# Patient Record
Sex: Female | Born: 1990 | Race: Black or African American | Hispanic: No | Marital: Single | Smoking: Former smoker
Health system: Southern US, Community
[De-identification: ages and names within clinical notes are randomized; demographics above are authoritative.]

## PROBLEM LIST (undated history)

## (undated) DIAGNOSIS — C8190 Hodgkin lymphoma, unspecified, unspecified site: Secondary | ICD-10-CM

## (undated) DIAGNOSIS — C801 Malignant (primary) neoplasm, unspecified: Secondary | ICD-10-CM

## (undated) HISTORY — PX: LYMPH NODE BIOPSY: SHX201

## (undated) HISTORY — PX: PORT-A-CATH REMOVAL: SHX5289

---
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
## 2011-07-19 ENCOUNTER — Emergency Department: Payer: Self-pay | Admitting: *Deleted

## 2011-07-21 ENCOUNTER — Emergency Department: Payer: Self-pay | Admitting: *Deleted

## 2012-03-03 ENCOUNTER — Emergency Department: Payer: Self-pay | Admitting: Emergency Medicine

## 2012-03-03 LAB — URINALYSIS, COMPLETE
Bilirubin,UR: NEGATIVE
Blood: NEGATIVE
Glucose,UR: NEGATIVE mg/dL (ref 0–75)
Specific Gravity: 1.027 (ref 1.003–1.030)
Squamous Epithelial: 6
WBC UR: 4 /HPF (ref 0–5)

## 2012-03-03 LAB — COMPREHENSIVE METABOLIC PANEL
Alkaline Phosphatase: 123 U/L (ref 50–136)
Anion Gap: 10 (ref 7–16)
Bilirubin,Total: 0.4 mg/dL (ref 0.2–1.0)
Calcium, Total: 8.7 mg/dL (ref 8.5–10.1)
Chloride: 105 mmol/L (ref 98–107)
Co2: 25 mmol/L (ref 21–32)
SGOT(AST): 26 U/L (ref 15–37)
SGPT (ALT): 20 U/L

## 2012-03-03 LAB — CBC
HCT: 35.6 % (ref 35.0–47.0)
MCH: 28.7 pg (ref 26.0–34.0)
MCV: 91 fL (ref 80–100)
Platelet: 276 10*3/uL (ref 150–440)
RBC: 3.92 10*6/uL (ref 3.80–5.20)
RDW: 13.9 % (ref 11.5–14.5)

## 2012-03-03 LAB — PREGNANCY, URINE: Pregnancy Test, Urine: POSITIVE m[IU]/mL

## 2012-03-22 ENCOUNTER — Emergency Department: Payer: Self-pay | Admitting: Emergency Medicine

## 2012-03-26 ENCOUNTER — Emergency Department: Payer: Self-pay | Admitting: Emergency Medicine

## 2012-03-27 LAB — CBC
HGB: 11.4 g/dL — ABNORMAL LOW (ref 12.0–16.0)
MCH: 28.9 pg (ref 26.0–34.0)
MCV: 88 fL (ref 80–100)
Platelet: 297 10*3/uL (ref 150–440)
RBC: 3.96 10*6/uL (ref 3.80–5.20)
RDW: 13.5 % (ref 11.5–14.5)
WBC: 11.1 10*3/uL — ABNORMAL HIGH (ref 3.6–11.0)

## 2012-03-27 LAB — COMPREHENSIVE METABOLIC PANEL
Albumin: 3.5 g/dL (ref 3.4–5.0)
Anion Gap: 7 (ref 7–16)
Bilirubin,Total: 0.2 mg/dL (ref 0.2–1.0)
Calcium, Total: 8.7 mg/dL (ref 8.5–10.1)
Chloride: 105 mmol/L (ref 98–107)
Co2: 26 mmol/L (ref 21–32)
Creatinine: 0.76 mg/dL (ref 0.60–1.30)
EGFR (African American): >60
Osmolality: 274 (ref 275–301)
Potassium: 3.7 mmol/L (ref 3.5–5.1)
Sodium: 138 mmol/L (ref 136–145)

## 2012-03-27 LAB — URINALYSIS, COMPLETE
Bilirubin,UR: NEGATIVE
Leukocyte Esterase: NEGATIVE
Ph: 6 (ref 4.5–8.0)
Protein: NEGATIVE
Specific Gravity: 1.018 (ref 1.003–1.030)
Squamous Epithelial: <1

## 2012-03-27 LAB — HCG, QUANTITATIVE, PREGNANCY: Beta Hcg, Quant.: 48960 m[IU]/mL — ABNORMAL HIGH

## 2012-10-04 ENCOUNTER — Emergency Department: Payer: Self-pay | Admitting: Emergency Medicine

## 2012-10-04 LAB — URINALYSIS, COMPLETE
Bilirubin,UR: NEGATIVE
Blood: NEGATIVE
Glucose,UR: NEGATIVE mg/dL (ref 0–75)
Ketone: NEGATIVE
Protein: NEGATIVE
RBC,UR: 2 /HPF (ref 0–5)
Specific Gravity: 1.013 (ref 1.003–1.030)
WBC UR: 4 /HPF (ref 0–5)

## 2012-10-04 LAB — CBC
HCT: 33 % — ABNORMAL LOW (ref 35.0–47.0)
HGB: 11 g/dL — ABNORMAL LOW (ref 12.0–16.0)
MCHC: 33.3 g/dL (ref 32.0–36.0)
MCV: 87 fL (ref 80–100)
Platelet: 254 10*3/uL (ref 150–440)
RDW: 13.6 % (ref 11.5–14.5)
WBC: 13.4 10*3/uL — ABNORMAL HIGH (ref 3.6–11.0)

## 2012-10-04 LAB — COMPREHENSIVE METABOLIC PANEL
Albumin: 3.2 g/dL — ABNORMAL LOW (ref 3.4–5.0)
Anion Gap: 9 (ref 7–16)
BUN: 5 mg/dL — ABNORMAL LOW (ref 7–18)
Bilirubin,Total: 0.3 mg/dL (ref 0.2–1.0)
Calcium, Total: 8.9 mg/dL (ref 8.5–10.1)
Chloride: 103 mmol/L (ref 98–107)
Creatinine: 0.58 mg/dL — ABNORMAL LOW (ref 0.60–1.30)
Osmolality: 268 (ref 275–301)
Potassium: 3.4 mmol/L — ABNORMAL LOW (ref 3.5–5.1)
SGOT(AST): 17 U/L (ref 15–37)
Sodium: 136 mmol/L (ref 136–145)

## 2012-12-16 ENCOUNTER — Observation Stay: Payer: Self-pay | Admitting: Obstetrics and Gynecology

## 2012-12-16 LAB — URINALYSIS, COMPLETE
Bacteria: NONE SEEN
Bilirubin,UR: NEGATIVE
Ketone: NEGATIVE
Leukocyte Esterase: NEGATIVE
Protein: NEGATIVE
RBC,UR: 1 /HPF (ref 0–5)
Specific Gravity: 1.014 (ref 1.003–1.030)
Squamous Epithelial: 1

## 2012-12-16 LAB — FETAL FIBRONECTIN
Appearance: NORMAL
Fetal Fibronectin: NEGATIVE

## 2013-10-22 ENCOUNTER — Emergency Department: Payer: Self-pay | Admitting: Emergency Medicine

## 2014-12-03 ENCOUNTER — Emergency Department
Admit: 2014-12-03 | Disposition: A | Discharge status: Home / Self Care | Payer: Self-pay | Admitting: Emergency Medicine

## 2014-12-03 LAB — URINALYSIS, COMPLETE
BILIRUBIN, UR: NEGATIVE
Glucose,UR: NEGATIVE mg/dL (ref 0–75)
Leukocyte Esterase: NEGATIVE
Nitrite: NEGATIVE
Ph: 6 (ref 4.5–8.0)
Protein: 100
Specific Gravity: 1.025 (ref 1.003–1.030)

## 2014-12-03 LAB — COMPREHENSIVE METABOLIC PANEL
ALBUMIN: 4.6 g/dL
ANION GAP: 9 (ref 7–16)
Alkaline Phosphatase: 118 U/L
BUN: 22 mg/dL — ABNORMAL HIGH
Bilirubin,Total: 1 mg/dL
Calcium, Total: 8.9 mg/dL
Chloride: 105 mmol/L
Co2: 24 mmol/L
Creatinine: 1.28 mg/dL — ABNORMAL HIGH
EGFR (African American): >60
EGFR (Non-African Amer.): 59 — ABNORMAL LOW
GLUCOSE: 99 mg/dL
POTASSIUM: 3.4 mmol/L — AB
SGOT(AST): 47 U/L — ABNORMAL HIGH
SGPT (ALT): 20 U/L
Sodium: 138 mmol/L
TOTAL PROTEIN: 8.2 g/dL — AB

## 2014-12-03 LAB — DRUG SCREEN, URINE
AMPHETAMINES, UR SCREEN: NEGATIVE
BENZODIAZEPINE, UR SCRN: NEGATIVE
Barbiturates, Ur Screen: NEGATIVE
COCAINE METABOLITE, UR ~~LOC~~: NEGATIVE
Cannabinoid 50 Ng, Ur ~~LOC~~: NEGATIVE
MDMA (ECSTASY) UR SCREEN: NEGATIVE
Methadone, Ur Screen: NEGATIVE
Opiate, Ur Screen: NEGATIVE
Phencyclidine (PCP) Ur S: NEGATIVE
Tricyclic, Ur Screen: NEGATIVE

## 2014-12-03 LAB — ETHANOL

## 2014-12-03 LAB — CBC
HCT: 34.4 % — AB (ref 35.0–47.0)
HGB: 11.1 g/dL — ABNORMAL LOW (ref 12.0–16.0)
MCH: 27.9 pg (ref 26.0–34.0)
MCHC: 32.3 g/dL (ref 32.0–36.0)
MCV: 86 fL (ref 80–100)
Platelet: 355 10*3/uL (ref 150–440)
RBC: 3.99 10*6/uL (ref 3.80–5.20)
RDW: 14.3 % (ref 11.5–14.5)
WBC: 18.4 10*3/uL — AB (ref 3.6–11.0)

## 2014-12-03 LAB — ACETAMINOPHEN LEVEL: Acetaminophen: <10 ug/mL

## 2014-12-03 LAB — SALICYLATE LEVEL: Salicylates, Serum: <4 mg/dL

## 2016-02-23 ENCOUNTER — Emergency Department
Admission: EM | Admit: 2016-02-23 | Discharge: 2016-02-24 | Disposition: A | Discharge status: Home / Self Care | Payer: Medicaid Other | Attending: Student | Admitting: Student

## 2016-02-23 ENCOUNTER — Encounter: Payer: Self-pay | Admitting: Emergency Medicine

## 2016-02-23 ENCOUNTER — Emergency Department: Payer: Medicaid Other

## 2016-02-23 DIAGNOSIS — Z79899 Other long term (current) drug therapy: Secondary | ICD-10-CM | POA: Insufficient documentation

## 2016-02-23 DIAGNOSIS — R079 Chest pain, unspecified: Secondary | ICD-10-CM | POA: Insufficient documentation

## 2016-02-23 DIAGNOSIS — R002 Palpitations: Secondary | ICD-10-CM | POA: Diagnosis not present

## 2016-02-23 DIAGNOSIS — R42 Dizziness and giddiness: Secondary | ICD-10-CM | POA: Diagnosis present

## 2016-02-23 DIAGNOSIS — Z8571 Personal history of Hodgkin lymphoma: Secondary | ICD-10-CM | POA: Insufficient documentation

## 2016-02-23 DIAGNOSIS — C801 Malignant (primary) neoplasm, unspecified: Secondary | ICD-10-CM | POA: Insufficient documentation

## 2016-02-23 HISTORY — DX: Hodgkin lymphoma, unspecified, unspecified site: C81.90

## 2016-02-23 HISTORY — DX: Malignant (primary) neoplasm, unspecified: C80.1

## 2016-02-23 LAB — CBC WITH DIFFERENTIAL/PLATELET
BASOS ABS: 0.1 10*3/uL (ref 0–0.1)
BASOS PCT: 1 %
EOS ABS: 0.1 10*3/uL (ref 0–0.7)
EOS PCT: 0 %
HCT: 33.6 % — ABNORMAL LOW (ref 35.0–47.0)
Hemoglobin: 11.2 g/dL — ABNORMAL LOW (ref 12.0–16.0)
Lymphocytes Relative: 24 %
Lymphs Abs: 3.5 10*3/uL (ref 1.0–3.6)
MCH: 28.5 pg (ref 26.0–34.0)
MCHC: 33.4 g/dL (ref 32.0–36.0)
MCV: 85.3 fL (ref 80.0–100.0)
Monocytes Absolute: 0.7 10*3/uL (ref 0.2–0.9)
Monocytes Relative: 5 %
Neutro Abs: 10.3 10*3/uL — ABNORMAL HIGH (ref 1.4–6.5)
Neutrophils Relative %: 70 %
PLATELETS: 370 10*3/uL (ref 150–440)
RBC: 3.94 MIL/uL (ref 3.80–5.20)
RDW: 13.4 % (ref 11.5–14.5)
WBC: 14.7 10*3/uL — AB (ref 3.6–11.0)

## 2016-02-23 LAB — COMPREHENSIVE METABOLIC PANEL
ALT: 17 U/L (ref 14–54)
AST: 21 U/L (ref 15–41)
Albumin: 4.1 g/dL (ref 3.5–5.0)
Alkaline Phosphatase: 108 U/L (ref 38–126)
Anion gap: 9 (ref 5–15)
BUN: 17 mg/dL (ref 6–20)
CHLORIDE: 107 mmol/L (ref 101–111)
CO2: 22 mmol/L (ref 22–32)
Calcium: 8.5 mg/dL — ABNORMAL LOW (ref 8.9–10.3)
Creatinine, Ser: 0.88 mg/dL (ref 0.44–1.00)
GFR calc Af Amer: >60 mL/min (ref >60)
GFR calc non Af Amer: >60 mL/min (ref >60)
Glucose, Bld: 95 mg/dL (ref 65–99)
Potassium: 3.1 mmol/L — ABNORMAL LOW (ref 3.5–5.1)
SODIUM: 138 mmol/L (ref 135–145)
Total Bilirubin: 0.4 mg/dL (ref 0.3–1.2)
Total Protein: 7.5 g/dL (ref 6.5–8.1)

## 2016-02-23 LAB — FIBRIN DERIVATIVES D-DIMER (ARMC ONLY): FIBRIN DERIVATIVES D-DIMER (ARMC): 168 (ref 0–499)

## 2016-02-23 LAB — TROPONIN I: Troponin I: <0.03 ng/mL (ref <0.03)

## 2016-02-23 LAB — TSH: TSH: 0.367 u[IU]/mL (ref 0.350–4.500)

## 2016-02-23 LAB — GLUCOSE, CAPILLARY: Glucose-Capillary: 103 mg/dL — ABNORMAL HIGH (ref 65–99)

## 2016-02-23 LAB — T4, FREE: Free T4: 0.99 ng/dL (ref 0.61–1.12)

## 2016-02-23 MED ORDER — ONDANSETRON HCL 4 MG/2ML IJ SOLN
4.0000 mg | Freq: Once | INTRAMUSCULAR | Status: AC
Start: 2016-02-23 — End: 2016-02-23
  Administered 2016-02-23: 4 mg via INTRAVENOUS
  Filled 2016-02-23: qty 2

## 2016-02-23 MED ORDER — SODIUM CHLORIDE 0.9 % IV BOLUS (SEPSIS)
1000.0000 mL | Freq: Once | INTRAVENOUS | Status: AC
  Administered 2016-02-23: 1000 mL via INTRAVENOUS

## 2016-02-23 MED ORDER — OXYCODONE HCL 5 MG PO TABS
10.0000 mg | ORAL_TABLET | Freq: Once | ORAL | Status: AC
  Administered 2016-02-23: 10 mg via ORAL
  Filled 2016-02-23: qty 2

## 2016-02-23 MED ORDER — ACETAMINOPHEN 500 MG PO TABS
1000.0000 mg | ORAL_TABLET | Freq: Once | ORAL | Status: AC
  Administered 2016-02-23: 1000 mg via ORAL
  Filled 2016-02-23: qty 2

## 2016-02-23 NOTE — ED Notes (Addendum)
Pt arrives via ems from a parking lot after beginning to feel numb, tingly, and dizzy. Pt thought because she hadn't eaten she was feeling that way, no hx of diabetes, ems reports initial heart rate of 170, pt was given 12mg  adenosine and then 10mg  of cardiazem, pt then could vagal and bring her heart rate down to the 120's at arrival to Er pt's rate is 130-140. Pt c/o pain at her supraclavicular notch. Pt is postpartum 6 mos

## 2016-02-23 NOTE — ED Provider Notes (Signed)
[facility] Emergency Department Provider Note   ____________________________________________  Time seen: Approximately 9:34 PM  I have reviewed the triage vital signs and the nursing notes.   HISTORY  Chief Complaint Tachycardia    HPI AKELIA LAUGHTON is a 25 y.o. female with remote history of Hodgkin's lymphoma at the age of 56, no recurrence, who presents for evaluation of tachycardia, shortness of breath, numbness in her arms and legs, lightheadedness, chest pain that began suddenly just prior to arrival while she was driving, gradual onset, now significantly improved, no modifying factors. Patient reports that she was driving to a fast food restaurant when she began feeling "like everything was swelling", that she had numbness in her arms and legs, felt very lightheaded. She pulled her car over and called 911. On EMS arrival her heart rate was in the 170s, she received 12 mg of adenosine with no change, she received 10 mg of diltiazem with some improvement to 150s and then with Valsalva maneuver her heart rate would improve to the 120s. She denies any history of coronary artery disease, no history of blood clots, no family history of heart troubles in young people. Prior to today, she had been in her usual state of health without illness.   Past Medical History  Diagnosis Date  . Cancer ([place])   . Hodgkin's lymphoma [facility])     Patient Active Problem List   Diagnosis Date Noted  . Cancer ([place])     No past surgical history on file.  Current Outpatient Rx  Name  Route  Sig  Dispense  Refill  . ferrous sulfate 325 (65 FE) MG tablet   Oral   Take 325 mg by mouth 2 (two) times daily.           Allergies Morphine and related  No family history on file.  Social History Social History  Substance Use Topics  . Smoking status: Not on file  . Smokeless tobacco: Not on file  . Alcohol Use: Not on file    Review of Systems Constitutional: No  fever/chills Eyes: No visual changes. ENT: No sore throat. Cardiovascular: + chest pain. Respiratory: Denies shortness of breath. Gastrointestinal: No abdominal pain.  No nausea, no vomiting.  No diarrhea.  No constipation. Genitourinary: Negative for dysuria. Musculoskeletal: Negative for back pain. Skin: Negative for rash. Neurological: Negative for headaches, focal weakness or numbness.  10-point ROS otherwise negative.  ____________________________________________   PHYSICAL EXAM:  Filed Vitals:   02/23/16 2304 02/23/16 2308 02/23/16 2312 02/23/16 2324  BP:      Pulse: 101 99 98 101  Temp:      TempSrc:      Resp: 19 17 17 24   Height:      Weight:      SpO2: 98% 98% 98% 98%    VITAL SIGNS: ED Triage Vitals  Enc Vitals Group     BP --      Pulse --      Resp --      Temp --      Temp src --      SpO2 --      Weight --      Height --      Head Cir --      Peak Flow --      Pain Score --      Pain Loc --      Pain Edu? --      Excl. in [place]? --  Constitutional: Alert and oriented. Nontoxic-appearing, mildly anxious. Eyes: Conjunctivae are normal. PERRL. EOMI. Head: Atraumatic. Nose: No congestion/rhinnorhea. Mouth/Throat: Mucous membranes are moist.  Oropharynx non-erythematous. Neck: No stridor.   Cardiovascular: tachycardic rate, regular rhythm. Grossly normal heart sounds.  Good peripheral circulation. Respiratory: Normal respiratory effort.  No retractions. Lungs CTAB. Gastrointestinal: Soft and nontender. No distention.  No CVA tenderness. Genitourinary: deferred Musculoskeletal: No lower extremity tenderness nor edema.  No joint effusions. No calf swelling, tenderness, edema. Neurologic:  Normal speech and language. No gross focal neurologic deficits are appreciated.  Skin:  Skin is warm, dry and intact. No rash noted. Psychiatric: Mood is anxious and affect is normal. Speech and behavior are  normal.  ____________________________________________   LABS (all labs ordered are listed, but only abnormal results are displayed)  Labs Reviewed  CBC WITH DIFFERENTIAL/PLATELET - Abnormal; Notable for the following:    WBC 14.7 (*)    Hemoglobin 11.2 (*)    HCT 33.6 (*)    Neutro Abs 10.3 (*)    All other components within normal limits  COMPREHENSIVE METABOLIC PANEL - Abnormal; Notable for the following:    Potassium 3.1 (*)    Calcium 8.5 (*)    All other components within normal limits  GLUCOSE, CAPILLARY - Abnormal; Notable for the following:    Glucose-Capillary 103 (*)    All other components within normal limits  TROPONIN I  TSH  T4, FREE  FIBRIN DERIVATIVES D-DIMER ([facility] ONLY)  URINALYSIS COMPLETEWITH MICROSCOPIC ([facility] ONLY)  TROPONIN I  URINE DRUG SCREEN, QUALITATIVE ([facility] ONLY)  POC URINE PREG, ED   ____________________________________________  EKG  ED ECG REPORT I, Joanne Gavel, the attending physician, personally viewed and interpreted this ECG.   Date: 02/23/2016  EKG Time: 21:32  Rate: 134  Rhythm: sinus tachycardia  Axis: normal  Intervals:none  ST&T Change: No acute ST elevation or acute ST depression. LVH.  ____________________________________________  RADIOLOGY  CXR IMPRESSION: No active disease.  ____________________________________________   PROCEDURES  Procedure(s) performed: None  Procedures  Critical Care performed: No  ____________________________________________   INITIAL IMPRESSION / ASSESSMENT AND PLAN / ED COURSE  Pertinent labs & imaging results that were available during my care of the patient were reviewed by me and considered in my medical decision making (see chart for details).  HARBOUR CONVEY is a 25 y.o. female with remote history of Hodgkin's lymphoma at the age of 58, no recurrence, who presents for evaluation of tachycardia, shortness of breath, numbness in her arms and legs, lightheadedness, chest  pain that began suddenly just prior to arrival while she was driving, gradual onset, now significantly improved, no modifying factors. On exam, she is mildly anxious appearing, her heart rate is in the 130s, sinus tachycardia is noted on her EKG. The remainder of her vital signs are stable and she is afebrile. At this time, she reports her chest pain has resolved. Her constellation of symptoms sound very much like a panic attack however will evaluate with screening labs, chest x-ray, d-dimer and troponin, we'll treat her symptomatically and reassess for disposition.  ----------------------------------------- 11:30 PM on 02/23/2016 ----------------------------------------- Patient's heart rate has improved to 98 bpm, she is resting comfortably. I reviewed her labs, her white blood cell count is 14.7 however she appears to have chronic mild leukocytosis on chart review. CMP with mild hypokalemia which is also chronic, negative troponin, normal thyroid studies, d-dimer not elevated, chest x-ray clear. Doubt PE, acute aortic dissection or ACS. A second troponin is  pending as the patient presented very close to onset of pain. If that is unremarkable and her urine studies are unremarkable and she continues to appear well, anticipate she can be discharged with close outpatient and likely psychiatric follow-up as this very well may represent panic attack. Care transferred to Dr. Dahlia Client at this time. ____________________________________________   FINAL CLINICAL IMPRESSION(S) / ED DIAGNOSES  Final diagnoses:  Chest pain, unspecified chest pain type  Heart palpitations      NEW MEDICATIONS STARTED DURING THIS VISIT:  New Prescriptions   No medications on file     Note:  This document was prepared using Dragon voice recognition software and may include unintentional dictation errors.    Joanne Gavel, MD 02/23/16 [phone]

## 2016-02-24 LAB — URINE DRUG SCREEN, QUALITATIVE (ARMC ONLY)
AMPHETAMINES, UR SCREEN: NOT DETECTED
Barbiturates, Ur Screen: NOT DETECTED
Benzodiazepine, Ur Scrn: NOT DETECTED
Cannabinoid 50 Ng, Ur ~~LOC~~: POSITIVE — AB
Cocaine Metabolite,Ur ~~LOC~~: NOT DETECTED
MDMA (Ecstasy)Ur Screen: NOT DETECTED
Methadone Scn, Ur: NOT DETECTED
Opiate, Ur Screen: NOT DETECTED
PHENCYCLIDINE (PCP) UR S: NOT DETECTED
TRICYCLIC, UR SCREEN: NOT DETECTED

## 2016-02-24 LAB — URINALYSIS COMPLETE WITH MICROSCOPIC (ARMC ONLY)
BACTERIA UA: NONE SEEN
Bilirubin Urine: NEGATIVE
GLUCOSE, UA: NEGATIVE mg/dL
Leukocytes, UA: NEGATIVE
Nitrite: NEGATIVE
Protein, ur: 30 mg/dL — AB
SPECIFIC GRAVITY, URINE: 1.026 (ref 1.005–1.030)
pH: 6 (ref 5.0–8.0)

## 2016-02-24 LAB — POCT PREGNANCY, URINE: PREG TEST UR: NEGATIVE

## 2016-02-24 LAB — TROPONIN I

## 2016-02-24 NOTE — ED Provider Notes (Signed)
-----------------------------------------   2:03 AM on 02/24/2016 -----------------------------------------   Blood pressure 108/51, pulse 89, temperature 98.8 F (37.1 C), temperature source Oral, resp. rate 15, height 5\' 7"  (1.702 m), weight 195 lb (88.451 kg), last menstrual period 02/15/2016, SpO2 95 %.  Assuming care from Dr. Edd Fabian.  In short, Jill Rivers is a 25 y.o. female with a chief complaint of Tachycardia .  Refer to the original H&P for additional details.  The current plan of care is to follow up the results of the urinalysis, urine drug screen and the repeat troponin.  The patient's repeat troponin is negative, the patient's urinalysis is clean but the patient does have positive cannabinoids in her urine.  The patient has completed her fluid bolus and her heart rate is improved. She will be discharged to home.   Loney Hering, MD 02/24/16 [phone]

## 2016-12-17 ENCOUNTER — Emergency Department
Admission: EM | Admit: 2016-12-17 | Discharge: 2016-12-18 | Disposition: A | Discharge status: Home / Self Care | Payer: Self-pay | Attending: Emergency Medicine | Admitting: Emergency Medicine

## 2016-12-17 DIAGNOSIS — F19921 Other psychoactive substance use, unspecified with intoxication with delirium: Secondary | ICD-10-CM

## 2016-12-17 DIAGNOSIS — Z5181 Encounter for therapeutic drug level monitoring: Secondary | ICD-10-CM | POA: Insufficient documentation

## 2016-12-17 DIAGNOSIS — T50905A Adverse effect of unspecified drugs, medicaments and biological substances, initial encounter: Secondary | ICD-10-CM

## 2016-12-17 DIAGNOSIS — R410 Disorientation, unspecified: Secondary | ICD-10-CM

## 2016-12-17 DIAGNOSIS — Z79899 Other long term (current) drug therapy: Secondary | ICD-10-CM | POA: Insufficient documentation

## 2016-12-17 DIAGNOSIS — T481X4A Poisoning by skeletal muscle relaxants [neuromuscular blocking agents], undetermined, initial encounter: Secondary | ICD-10-CM | POA: Insufficient documentation

## 2016-12-17 DIAGNOSIS — F1210 Cannabis abuse, uncomplicated: Secondary | ICD-10-CM

## 2016-12-17 DIAGNOSIS — T50994A Poisoning by other drugs, medicaments and biological substances, undetermined, initial encounter: Secondary | ICD-10-CM | POA: Insufficient documentation

## 2016-12-17 DIAGNOSIS — Z8571 Personal history of Hodgkin lymphoma: Secondary | ICD-10-CM | POA: Insufficient documentation

## 2016-12-17 DIAGNOSIS — F29 Unspecified psychosis not due to a substance or known physiological condition: Secondary | ICD-10-CM | POA: Insufficient documentation

## 2016-12-17 DIAGNOSIS — T50904A Poisoning by unspecified drugs, medicaments and biological substances, undetermined, initial encounter: Secondary | ICD-10-CM

## 2016-12-18 ENCOUNTER — Emergency Department: Payer: Self-pay

## 2016-12-18 ENCOUNTER — Encounter: Payer: Self-pay | Admitting: Emergency Medicine

## 2016-12-18 DIAGNOSIS — T50905A Adverse effect of unspecified drugs, medicaments and biological substances, initial encounter: Secondary | ICD-10-CM

## 2016-12-18 DIAGNOSIS — F1210 Cannabis abuse, uncomplicated: Secondary | ICD-10-CM

## 2016-12-18 DIAGNOSIS — R410 Disorientation, unspecified: Secondary | ICD-10-CM

## 2016-12-18 DIAGNOSIS — F19921 Other psychoactive substance use, unspecified with intoxication with delirium: Secondary | ICD-10-CM

## 2016-12-18 LAB — CBC
HEMATOCRIT: 35.9 % (ref 35.0–47.0)
HEMOGLOBIN: 11.9 g/dL — AB (ref 12.0–16.0)
MCH: 28.4 pg (ref 26.0–34.0)
MCHC: 33.3 g/dL (ref 32.0–36.0)
MCV: 85.3 fL (ref 80.0–100.0)
PLATELETS: 353 10*3/uL (ref 150–440)
RBC: 4.21 MIL/uL (ref 3.80–5.20)
RDW: 14.9 % — ABNORMAL HIGH (ref 11.5–14.5)
WBC: 14.2 10*3/uL — AB (ref 3.6–11.0)

## 2016-12-18 LAB — ACETAMINOPHEN LEVEL: Acetaminophen (Tylenol), Serum: <10 ug/mL — ABNORMAL LOW (ref 10–30)

## 2016-12-18 LAB — COMPREHENSIVE METABOLIC PANEL
ALBUMIN: 4.5 g/dL (ref 3.5–5.0)
ALT: 15 U/L (ref 14–54)
AST: 21 U/L (ref 15–41)
Alkaline Phosphatase: 98 U/L (ref 38–126)
Anion gap: 7 (ref 5–15)
BUN: 17 mg/dL (ref 6–20)
CHLORIDE: 106 mmol/L (ref 101–111)
CO2: 27 mmol/L (ref 22–32)
Calcium: 9.3 mg/dL (ref 8.9–10.3)
Creatinine, Ser: 1.15 mg/dL — ABNORMAL HIGH (ref 0.44–1.00)
GFR calc Af Amer: >60 mL/min (ref >60)
GFR calc non Af Amer: >60 mL/min (ref >60)
GLUCOSE: 93 mg/dL (ref 65–99)
Potassium: 3.2 mmol/L — ABNORMAL LOW (ref 3.5–5.1)
SODIUM: 140 mmol/L (ref 135–145)
Total Bilirubin: 0.2 mg/dL — ABNORMAL LOW (ref 0.3–1.2)
Total Protein: 7.9 g/dL (ref 6.5–8.1)

## 2016-12-18 LAB — URINALYSIS, COMPLETE (UACMP) WITH MICROSCOPIC
BACTERIA UA: NONE SEEN
Bilirubin Urine: NEGATIVE
GLUCOSE, UA: NEGATIVE mg/dL
KETONES UR: NEGATIVE mg/dL
Leukocytes, UA: NEGATIVE
NITRITE: NEGATIVE
PROTEIN: NEGATIVE mg/dL
Specific Gravity, Urine: 1.006 (ref 1.005–1.030)
pH: 7 (ref 5.0–8.0)

## 2016-12-18 LAB — URINE DRUG SCREEN, QUALITATIVE (ARMC ONLY)
AMPHETAMINES, UR SCREEN: NOT DETECTED
Barbiturates, Ur Screen: NOT DETECTED
Benzodiazepine, Ur Scrn: NOT DETECTED
COCAINE METABOLITE, UR ~~LOC~~: NOT DETECTED
Cannabinoid 50 Ng, Ur ~~LOC~~: POSITIVE — AB
MDMA (ECSTASY) UR SCREEN: NOT DETECTED
Methadone Scn, Ur: NOT DETECTED
Opiate, Ur Screen: NOT DETECTED
PHENCYCLIDINE (PCP) UR S: NOT DETECTED
Tricyclic, Ur Screen: NOT DETECTED

## 2016-12-18 LAB — ETHANOL

## 2016-12-18 LAB — PREGNANCY, URINE: Preg Test, Ur: NEGATIVE

## 2016-12-18 LAB — SALICYLATE LEVEL: Salicylate Lvl: <7 mg/dL (ref 2.8–30.0)

## 2016-12-18 MED ORDER — LORAZEPAM 2 MG/ML IJ SOLN
1.0000 mg | Freq: Once | INTRAMUSCULAR | Status: AC
  Administered 2016-12-18: 1 mg via INTRAVENOUS
  Filled 2016-12-18: qty 1

## 2016-12-18 MED ORDER — LORAZEPAM 2 MG/ML IJ SOLN
1.0000 mg | Freq: Once | INTRAMUSCULAR | Status: AC
  Administered 2016-12-18: 1 mg via INTRAVENOUS

## 2016-12-18 MED ORDER — SODIUM CHLORIDE 0.9 % IV BOLUS (SEPSIS)
1000.0000 mL | Freq: Once | INTRAVENOUS | Status: AC
  Administered 2016-12-18: 1000 mL via INTRAVENOUS

## 2016-12-18 NOTE — ED Notes (Signed)
Patient in asking me to get Jill Rivers) ready to go. And patient asking her mother to take her to school ,mother and Jill Rivers is not here at this time.

## 2016-12-18 NOTE — ED Notes (Signed)
Dr.clapaac in room talking with patient

## 2016-12-18 NOTE — ED Notes (Signed)
Pt a/o. oob with standby assist without issue. Dr Quentin Cornwall notified. Discharge pending.

## 2016-12-18 NOTE — ED Provider Notes (Signed)
Patient received in sign-out from Dr. Clearnce Hasten.  Workup and evaluation pending further evaluation and assessment for sobriety. Patient has been evaluated by psychiatry at bedside.  Not felt to require an inpatient admission. After further observation in the ER the patient has metabolized any medications that she had overdosed on and is currently ambulating about the ER.  Tolerating PO.  She appears clinically appropriate and currently sober. Patient is stable for discharge home.      Merlyn Lot, MD 12/18/16 [phone]

## 2016-12-18 NOTE — ED Notes (Signed)
A cup of water was given to patient.

## 2016-12-18 NOTE — ED Notes (Signed)
Father in room visiting with patient

## 2016-12-18 NOTE — ED Provider Notes (Signed)
[facility] Emergency Department Provider Note   ____________________________________________   First MD Initiated Contact with Patient 12/18/16 [phone]     (approximate)  I have reviewed the triage vital signs and the nursing notes.   HISTORY  Chief Complaint Drug Overdose  Limited by intoxication  HPI Jill Rivers is a 26 y.o. female brought to the ED from home via EMS with a chief complaint of overdose. Patient admits to taking an unknown quantity of OTC sleep aids. States she has a long day at work tomorrow and was trying to get some rest. Denies intentional overdose but also unable to tell me whether or not she wishes to harm herself. Rest of history is unobtainable secondary to patient's intoxication.   Past Medical History:  Diagnosis Date  . Cancer ([place])   . Hodgkin's lymphoma [facility])     Patient Active Problem List   Diagnosis Date Noted  . Cancer [facility])     History reviewed. No pertinent surgical history.  Prior to Admission medications   Medication Sig Start Date End Date Taking? Authorizing Provider  ferrous sulfate 325 (65 FE) MG tablet Take 325 mg by mouth 2 (two) times daily. 08/09/15 08/08/16  Historical Provider, MD    Allergies Morphine and related  History reviewed. No pertinent family history.  Social History Social History  Substance Use Topics  . Smoking status: Never Smoker  . Smokeless tobacco: Never Used  . Alcohol use Yes    Review of Systems  Constitutional: No fever/chills. Eyes: No visual changes. ENT: No sore throat. Cardiovascular: Denies chest pain. Respiratory: Denies shortness of breath. Gastrointestinal: No abdominal pain.  No nausea, no vomiting.  No diarrhea.  No constipation. Genitourinary: Negative for dysuria. Musculoskeletal: Negative for back pain. Skin: Negative for rash. Neurological: Negative for headaches, focal weakness or numbness. Psychiatric:Positive for  hallucinations.  ____________________________________________   PHYSICAL EXAM:  VITAL SIGNS: ED Triage Vitals  Enc Vitals Group     BP 12/18/16 0004 (!) 149/85     Pulse Rate 12/18/16 0004 (!) 113     Resp 12/18/16 0004 18     Temp 12/18/16 0004 97.9 F (36.6 C)     Temp Source 12/18/16 0004 Oral     SpO2 12/18/16 0004 100 %     Weight 12/18/16 0005 195 lb (88.5 kg)     Height 12/18/16 0005 5\' 7"  (1.702 m)     Head Circumference --      Peak Flow --      Pain Score --      Pain Loc --      Pain Edu? --      Excl. in [place]? --     Constitutional: Alert, disoriented. Disheveled appearing and in mild acute distress. Reaching out to pick things in the air which are not there. Lifting shirt and is picking at her right breast. Eyes: Conjunctivae are normal. PERRL. EOMI. Head: Atraumatic. Nose: No congestion/rhinnorhea. Mouth/Throat: Mucous membranes are moist.  Oropharynx non-erythematous. Neck: No stridor.  No cervical spine tenderness to palpation. No carotid bruits. Cardiovascular: Normal rate, regular rhythm. Grossly normal heart sounds.  Good peripheral circulation. Respiratory: Normal respiratory effort.  No retractions. Lungs CTAB. Gastrointestinal: Soft and nontender. No distention. No abdominal bruits. No CVA tenderness. Musculoskeletal: No lower extremity tenderness nor edema.  No joint effusions. Neurologic:  Slurred speech and language. No gross focal neurologic deficits are appreciated. MAEx4. Skin:  Skin is warm, dry and intact. No rash noted. Psychiatric: Mood  and affect are bizarre. Speech and behavior are bizarre. Inappropriate laughter.  ____________________________________________   LABS (all labs ordered are listed, but only abnormal results are displayed)  Labs Reviewed  COMPREHENSIVE METABOLIC PANEL - Abnormal; Notable for the following:       Result Value   Potassium 3.2 (*)    Creatinine, Ser 1.15 (*)    Total Bilirubin 0.2 (*)    All other  components within normal limits  ACETAMINOPHEN LEVEL - Abnormal; Notable for the following:    Acetaminophen (Tylenol), Serum <10 (*)    All other components within normal limits  CBC - Abnormal; Notable for the following:    WBC 14.2 (*)    Hemoglobin 11.9 (*)    RDW 14.9 (*)    All other components within normal limits  URINE DRUG SCREEN, QUALITATIVE ([facility] ONLY) - Abnormal; Notable for the following:    Cannabinoid 50 Ng, Ur [place] POSITIVE (*)    All other components within normal limits  URINALYSIS, COMPLETE (UACMP) WITH MICROSCOPIC - Abnormal; Notable for the following:    Color, Urine STRAW (*)    APPearance CLEAR (*)    Hgb urine dipstick MODERATE (*)    Squamous Epithelial / LPF 0-5 (*)    All other components within normal limits  BLOOD GAS, ARTERIAL - Abnormal; Notable for the following:    pH, Arterial 7.48 (*)    pO2, Arterial 112 (*)    Acid-Base Excess 2.8 (*)    All other components within normal limits  ETHANOL  SALICYLATE LEVEL  PREGNANCY, URINE   ____________________________________________  EKG  ED ECG REPORT I, Latonja Bobeck J, the attending physician, personally viewed and interpreted this ECG.   Date: 12/18/2016  EKG Time: 0048  Rate: 105  Rhythm: sinus tachycardia  Axis: Normal  Intervals:none  ST&T Change: Nonspecific  ____________________________________________  RADIOLOGY  CT head interpreted per Dr. Alroy Dust: Normal brain  Portable chest x-ray interpreted per Dr. Alroy Dust: No active disease ____________________________________________   PROCEDURES  Procedure(s) performed: None  Procedures  Critical Care performed: Yes, see critical care note(s)   CRITICAL CARE Performed by: Paulette Blanch   Total critical care time: 30 minutes  Critical care time was exclusive of separately billable procedures and treating other patients.  Critical care was necessary to treat or prevent imminent or life-threatening deterioration.  Critical care  was time spent personally by me on the following activities: development of treatment plan with patient and/or surrogate as well as nursing, discussions with consultants, evaluation of patient's response to treatment, examination of patient, obtaining history from patient or surrogate, ordering and performing treatments and interventions, ordering and review of laboratory studies, ordering and review of radiographic studies, pulse oximetry and re-evaluation of patient's condition.  ____________________________________________   INITIAL IMPRESSION / ASSESSMENT AND PLAN / ED COURSE  Pertinent labs & imaging results that were available during my care of the patient were reviewed by me and considered in my medical decision making (see chart for details).  26 year old female who presents after overdose of sleeping aids. She is obviously hallucinating, exhibiting bizarre behavior, picking at the air. Suspect anticholinergic toxidrome. Will initiate IV fluid resuscitation, administer benzodiazepine. Will obtain screening toxicological labwork, CT head and continue to monitor. Will place patient under involuntary commitment for her safety.  Clinical Course as of Dec 18 652  Fri Dec 18, 2016  0256 Patient continues to try to get out of bed. Additional Ativan ordered.  [JS]  [id number] Patient had no further events after  additional Ativan was given. She has slept soundly since. Imaging studies, laboratory and urinalysis results remarkable for cannabinoids. She may be medically cleared when she is awake, sober and ambulatory. She remains under involuntary commitment pending psychiatric evaluation.  [JS]    Clinical Course User Index [JS] Paulette Blanch, MD     ____________________________________________   FINAL CLINICAL IMPRESSION(S) / ED DIAGNOSES  Final diagnoses:  Drug overdose, undetermined intent, initial encounter  Marijuana abuse  Psychosis, unspecified psychosis type      NEW MEDICATIONS  STARTED DURING THIS VISIT:  New Prescriptions   No medications on file     Note:  This document was prepared using Dragon voice recognition software and may include unintentional dictation errors.    Paulette Blanch, MD 12/18/16 [phone]

## 2016-12-18 NOTE — ED Notes (Signed)
Patient asking to pick stuff off of floor ,but the floor is empty

## 2016-12-18 NOTE — ED Notes (Signed)
Patient had a visit , visitor got upset when I could not answer her question about patient was going to leave asked visitor would she like to talk with the nurse Bill , she states I'm going to smoke  I'll be back and stormed out.

## 2016-12-18 NOTE — ED Notes (Signed)
Patient in bathroom

## 2016-12-18 NOTE — Consult Note (Signed)
[place] [facility] Consult   Reason for Consult:  Consult for 26 year old woman brought to the hospital last night with altered mental status Referring Physician:  Quentin Cornwall Patient Identification: Jill Rivers MRN:  [id number] Principal Diagnosis: Delirium, drug-induced [facility]) Diagnosis:   Patient Active Problem List   Diagnosis Date Noted  . Delirium, drug-induced ([place]) [E15.830] 12/18/2016  . Cannabis abuse [F12.10] 12/18/2016  . Cancer ([place]) [C80.1]     Total Time spent with patient: 1 hour  Subjective:   Jill Rivers is a 26 y.o. female patient admitted with "I'm okay".  HPI:  Patient interviewed. Chart reviewed. This is a 26 year old woman brought to the hospital last night by EMS. They were called by family because the patient had an altered mental status. Patient was unresponsive and unarousable when she first arrived. History from the family suggested that she had taken an excessive number of sleeping pills probably over-the-counter sleeping pills as well as Flexeril. Patient apparently was able to communicate that she had no suicidal thought and she was just trying to sleep. Family communicated having no concern that she was trying to kill her self no recent depression or psychiatric concerns. On evaluation today I initially found the patient still only partially oriented but enough to give some history. She reports that she was at a family member's home last night and took a couple pills because she wanted to get some sleep. Specifically Flexeril. Can't remember how many she took. She denies that she's been depressed. Denies any thoughts at all about hurting herself or dieting. Denies any thoughts of hurting anyone else. Admits that she uses marijuana fairly regularly denies any other alcohol or drug abuse. Says her life is chronically stressful being a single mother of several children and working full time but no more so than usual.  : Lives on her own and apparently  has 3 young children. The father of the children is at least partially involved and sometimes takes care of them although right now it sounds like other family members of stepped in. Patient reports that she has 2 different jobs. She feels like she has a good relationship with her family.  Medical history: Patient had Hodgkin's disease when she was 26 years old which apparently is judged to be completely cured. Doesn't appear to be an active medical issue at this point there are no other medical problems.  Substance abuse history: Admits that she uses marijuana fairly regularly a few times a week. Denies other drug abuse denies alcohol abuse.  Past Psychiatric History: No known past psychiatric history. No history of psychiatric hospitalization. No history of suicide attempts or violence. No history of prescription of psychiatric medicine.  Risk to Self: Suicidal Ideation: No Suicidal Intent: No Is patient at risk for suicide?: No Suicidal Plan?: No Access to Means: No What has been your use of drugs/alcohol within the last 12 months?: None reported by collateral How many times?: 0 Other Self Harm Risks: none identified Triggers for Past Attempts: None known Intentional Self Injurious Behavior: None Risk to Others: Homicidal Ideation: No Thoughts of Harm to Others: No Current Homicidal Intent: No Current Homicidal Plan: No Access to Homicidal Means: No Identified Victim: none identified History of harm to others?: No Assessment of Violence: None Noted Violent Behavior Description: none identified Does patient have access to weapons?: No Criminal Charges Pending?: No Does patient have a court date: No Prior Inpatient Therapy: Prior Inpatient Therapy: No Prior Therapy Dates: na Prior Therapy Facilty/Provider(s): na  Reason for Treatment: na Prior Outpatient Therapy: Prior Outpatient Therapy: No Prior Therapy Dates: na Prior Therapy Facilty/Provider(s): na Reason for Treatment:  na Does patient have an ACCT team?: No Does patient have Intensive In-House Services?  : No Does patient have [facility] services? : No Does patient have P4CC services?: No  Past Medical History:  Past Medical History:  Diagnosis Date  . Cancer ([place])   . Hodgkin's lymphoma ([place])    History reviewed. No pertinent surgical history. Family History: History reviewed. No pertinent family history. Family Psychiatric  History: Only history is that she has one uncle who has intellectual disability. Social History:  History  Alcohol Use  . Yes     History  Drug Use No    Social History   Social History  . Marital status: Single    Spouse name: N/A  . Number of children: N/A  . Years of education: N/A   Social History Main Topics  . Smoking status: Never Smoker  . Smokeless tobacco: Never Used  . Alcohol use Yes  . Drug use: No  . Sexual activity: Yes   Other Topics Concern  . None   Social History Narrative  . None   Additional Social History:    Allergies:   Allergies  Allergen Reactions  . Morphine And Related Other (See Comments)    hallucinations    Labs:  Results for orders placed or performed during the hospital encounter of 12/17/16 (from the past 48 hour(s))  Urine Drug Screen, Qualitative     Status: Abnormal   Collection Time: 12/18/16 12:50 AM  Result Value Ref Range   Tricyclic, Ur Screen NONE DETECTED NONE DETECTED   Amphetamines, Ur Screen NONE DETECTED NONE DETECTED   MDMA (Ecstasy)Ur Screen NONE DETECTED NONE DETECTED   Cocaine Metabolite,Ur [place] NONE DETECTED NONE DETECTED   Opiate, Ur Screen NONE DETECTED NONE DETECTED   Phencyclidine (PCP) Ur S NONE DETECTED NONE DETECTED   Cannabinoid 50 Ng, Ur [place] POSITIVE (A) NONE DETECTED   Barbiturates, Ur Screen NONE DETECTED NONE DETECTED   Benzodiazepine, Ur Scrn NONE DETECTED NONE DETECTED   Methadone Scn, Ur NONE DETECTED NONE DETECTED    Comment: (NOTE) 659  Tricyclics, urine               Cutoff  1000 ng/mL 200  Amphetamines, urine             Cutoff 1000 ng/mL 300  MDMA (Ecstasy), urine           Cutoff 500 ng/mL 400  Cocaine Metabolite, urine       Cutoff 300 ng/mL 500  Opiate, urine                   Cutoff 300 ng/mL 600  Phencyclidine (PCP), urine      Cutoff 25 ng/mL 700  Cannabinoid, urine              Cutoff 50 ng/mL 800  Barbiturates, urine             Cutoff 200 ng/mL 900  Benzodiazepine, urine           Cutoff 200 ng/mL 1000 Methadone, urine                Cutoff 300 ng/mL 1100 1200 The urine drug screen provides only a preliminary, unconfirmed 1300 analytical test result and should not be used for non-medical 1400 purposes. Clinical consideration and professional judgment should 1500 be applied to any  positive drug screen result due to possible 1600 interfering substances. A more specific alternate chemical method 1700 must be used in order to obtain a confirmed analytical result.  1800 Gas chromato graphy / mass spectrometry (GC/MS) is the preferred 1900 confirmatory method.   Urinalysis, Complete w Microscopic     Status: Abnormal   Collection Time: 12/18/16 12:50 AM  Result Value Ref Range   Color, Urine STRAW (A) YELLOW   APPearance CLEAR (A) CLEAR   Specific Gravity, Urine 1.006 1.005 - 1.030   pH 7.0 5.0 - 8.0   Glucose, UA NEGATIVE NEGATIVE mg/dL   Hgb urine dipstick MODERATE (A) NEGATIVE   Bilirubin Urine NEGATIVE NEGATIVE   Ketones, ur NEGATIVE NEGATIVE mg/dL   Protein, ur NEGATIVE NEGATIVE mg/dL   Nitrite NEGATIVE NEGATIVE   Leukocytes, UA NEGATIVE NEGATIVE   RBC / HPF 0-5 0 - 5 RBC/hpf   WBC, UA 0-5 0 - 5 WBC/hpf   Bacteria, UA NONE SEEN NONE SEEN   Squamous Epithelial / LPF 0-5 (A) NONE SEEN   Mucous PRESENT   Pregnancy, urine     Status: None   Collection Time: 12/18/16 12:50 AM  Result Value Ref Range   Preg Test, Ur NEGATIVE NEGATIVE  Comprehensive metabolic panel     Status: Abnormal   Collection Time: 12/18/16 12:58 AM  Result Value  Ref Range   Sodium 140 135 - 145 mmol/L   Potassium 3.2 (L) 3.5 - 5.1 mmol/L   Chloride 106 101 - 111 mmol/L   CO2 27 22 - 32 mmol/L   Glucose, Bld 93 65 - 99 mg/dL   BUN 17 6 - 20 mg/dL   Creatinine, Ser 1.15 (H) 0.44 - 1.00 mg/dL   Calcium 9.3 8.9 - 10.3 mg/dL   Total Protein 7.9 6.5 - 8.1 g/dL   Albumin 4.5 3.5 - 5.0 g/dL   AST 21 15 - 41 U/L   ALT 15 14 - 54 U/L   Alkaline Phosphatase 98 38 - 126 U/L   Total Bilirubin 0.2 (L) 0.3 - 1.2 mg/dL   GFR calc non Af Amer >60 >60 mL/min   GFR calc Af Amer >60 >60 mL/min    Comment: (NOTE) The eGFR has been calculated using the CKD EPI equation. This calculation has not been validated in all clinical situations. eGFR's persistently <60 mL/min signify possible Chronic Kidney Disease.    Anion gap 7 5 - 15  Ethanol     Status: None   Collection Time: 12/18/16 12:58 AM  Result Value Ref Range   Alcohol, Ethyl (B) <5 <5 mg/dL    Comment:        LOWEST DETECTABLE LIMIT FOR SERUM ALCOHOL IS 5 mg/dL FOR MEDICAL PURPOSES ONLY   Salicylate level     Status: None   Collection Time: 12/18/16 12:58 AM  Result Value Ref Range   Salicylate Lvl <4.3 2.8 - 30.0 mg/dL  Acetaminophen level     Status: Abnormal   Collection Time: 12/18/16 12:58 AM  Result Value Ref Range   Acetaminophen (Tylenol), Serum <10 (L) 10 - 30 ug/mL    Comment:        THERAPEUTIC CONCENTRATIONS VARY SIGNIFICANTLY. A RANGE OF 10-30 ug/mL MAY BE AN EFFECTIVE CONCENTRATION FOR MANY PATIENTS. HOWEVER, SOME ARE BEST TREATED AT CONCENTRATIONS OUTSIDE THIS RANGE. ACETAMINOPHEN CONCENTRATIONS >150 ug/mL AT 4 HOURS AFTER INGESTION AND >50 ug/mL AT 12 HOURS AFTER INGESTION ARE OFTEN ASSOCIATED WITH TOXIC REACTIONS.   cbc  Status: Abnormal   Collection Time: 12/18/16 12:58 AM  Result Value Ref Range   WBC 14.2 (H) 3.6 - 11.0 K/uL   RBC 4.21 3.80 - 5.20 MIL/uL   Hemoglobin 11.9 (L) 12.0 - 16.0 g/dL   HCT 35.9 35.0 - 47.0 %   MCV 85.3 80.0 - 100.0 fL   MCH 28.4  26.0 - 34.0 pg   MCHC 33.3 32.0 - 36.0 g/dL   RDW 14.9 (H) 11.5 - 14.5 %   Platelets 353 150 - 440 K/uL  Blood gas, arterial     Status: Abnormal (Preliminary result)   Collection Time: 12/18/16  1:37 AM  Result Value Ref Range   FIO2 0.21    pH, Arterial 7.48 (H) 7.350 - 7.450   pCO2 arterial 35 32.0 - 48.0 mmHg   pO2, Arterial 112 (H) 83.0 - 108.0 mmHg   Bicarbonate 26.1 20.0 - 28.0 mmol/L   Acid-Base Excess 2.8 (H) 0.0 - 2.0 mmol/L   O2 Saturation 98.7 %   Patient temperature 37.0    Collection site RIGHT RADIAL    Sample type ARTERIAL DRAW    Allens test (pass/fail) PASS PASS   Mechanical Rate PENDING     No current facility-administered medications for this encounter.    Current Outpatient Prescriptions  Medication Sig Dispense Refill  . ferrous sulfate 325 (65 FE) MG tablet Take 325 mg by mouth 2 (two) times daily.      Musculoskeletal: Strength & Muscle Tone: within normal limits Gait & Station: normal Patient leans: N/A  Psychiatric Specialty Exam: Physical Exam  Nursing note and vitals reviewed. Constitutional: She appears well-developed and well-nourished.  HENT:  Head: Normocephalic and atraumatic.  Eyes: Conjunctivae are normal. Pupils are equal, round, and reactive to light.  Neck: Normal range of motion.  Cardiovascular: Regular rhythm and normal heart sounds.   Respiratory: Effort normal. No respiratory distress.  GI: Soft.  Musculoskeletal: Normal range of motion.  Neurological: She is alert.  Skin: Skin is warm and dry.  Psychiatric: Judgment normal. Her affect is blunt. Her speech is delayed. She is slowed. Thought content is not paranoid. Cognition and memory are impaired. She expresses no homicidal and no suicidal ideation. She exhibits abnormal recent memory.    Review of Systems  Constitutional: Negative.   HENT: Negative.   Eyes: Negative.   Respiratory: Negative.   Cardiovascular: Negative.   Gastrointestinal: Negative.    Musculoskeletal: Negative.   Skin: Negative.   Neurological: Negative.   Psychiatric/Behavioral: Positive for substance abuse. Negative for depression, hallucinations, memory loss and suicidal ideas. The patient is not nervous/anxious and does not have insomnia.     Blood pressure 119/67, pulse 78, temperature 97.6 F (36.4 C), temperature source Oral, resp. rate 16, height 5' 7" (1.702 m), weight 88.5 kg (195 lb), last menstrual period 12/01/2016, SpO2 98 %.Body mass index is 30.54 kg/m.  General Appearance: Casual  Eye Contact:  Fair  Speech:  Clear and Coherent  Volume:  Decreased  Mood:  Euthymic  Affect:  Constricted  Thought Process:  Goal Directed  Orientation:  Full (Time, Place, and Person)  Thought Content:  Logical  Suicidal Thoughts:  No  Homicidal Thoughts:  No  Memory:  Immediate;   Good Recent;   Fair Remote;   Fair  Judgement:  Fair  Insight:  Fair  Psychomotor Activity:  Decreased  Concentration:  Concentration: Fair  Recall:  [employer] of Knowledge:  Fair  Language:  Fair  Akathisia:  No  Handed:  Right  AIMS (if indicated):     Assets:  Desire for Improvement Housing Physical Health Resilience Social Support  ADL's:  Impaired  Cognition:  Impaired,  Mild  Sleep:        Treatment Plan Summary: Plan I saw the patient before lunch today and at that time she was still drowsy and intermittently delirious. Mostly able to hold a conversation but occasionally would insert strange statements or peer to not know where she was. I reevaluated her just now and found her to a cleared up significantly which is what I had expected. She was fully oriented. Didn't make any bizarre or confused statements and was able to stay on topic. This appears to be a transient delirium related to substances. Patient educated about the dangers of overusing any kind of sedating medicine. Discontinue IVC. Case reviewed with TTS and emergency room physician. Patient can be discharged  from the emergency room once she is stable on her feet.  Disposition: Patient does not meet criteria for psychiatric inpatient admission. Supportive therapy provided about ongoing stressors.  Alethia Berthold, MD 12/18/2016 3:53 PM

## 2016-12-18 NOTE — ED Notes (Signed)
Visitor, sister left with in minutes of visit tearful, when enquiring with ED Jill Rivers, the visitor was asking question that was directed to when pt will be discharged.

## 2016-12-18 NOTE — ED Triage Notes (Addendum)
Patient presents to Emergency Department via [facility] with complaints of appearent drug overdose.  Per EMS pt took flexeril and something else but unable to obtain further info.  Pt reports taking unknown quantity of unknown type of OTC "sleeping pills" .  Pt is oriented to self time and situation but thinks this RN is the President of the [place], pt unable to answer questions without talking about drinks and specials at [employer] and asking if we're going to [facility] and other non-sequitur's to conversation.  Denies ingestion and drug abuse but reports "I got a long day tomorrow at the nursing home. I work at [employer]."

## 2016-12-18 NOTE — ED Notes (Addendum)
Breakfast was given to patient , but patient unable to focus on eating and drinking , patient picking at unseen things in the air, will try again

## 2016-12-18 NOTE — ED Notes (Signed)
Pt was incontinent and soiled herself. Pt was changed by this tech and EDT beth

## 2016-12-18 NOTE — ED Notes (Signed)
Lab called for add on 

## 2016-12-18 NOTE — ED Notes (Signed)
Offered patient juice patient refused

## 2016-12-18 NOTE — ED Notes (Signed)
Patient transported to CT 

## 2016-12-18 NOTE — BH Assessment (Signed)
Assessment Note  Jill Rivers is an 26 y.o. female presenting to ED via [place] with concerns of an apparent unintentional drug overdose.  Per EMS pt took flexeril and some unknown OTC sleeping pills.  Pt was nonverbal during assessment.  Collateral information obtained from patient's sister who reports no known psychiatric history for pt.  She states to her knowledge, pt took the pain medications plus sleeping pills in an attempt to try to get some sleep.  She states that pt has not had any previous SI/HI or any auditory/visual hallucinations.  Diagnosis: Unintentional Overdose  Past Medical History:  Past Medical History:  Diagnosis Date  . Cancer ([place])   . Hodgkin's lymphoma ([place])     History reviewed. No pertinent surgical history.  Family History: History reviewed. No pertinent family history.  Social History:  reports that she has never smoked. She has never used smokeless tobacco. She reports that she drinks alcohol. She reports that she does not use drugs.  Additional Social History:  Alcohol / Drug Use Pain Medications: See PTA Prescriptions: See PTA Over the Counter: See PTA History of alcohol / drug use?: No history of alcohol / drug abuse  CIWA: CIWA-Ar BP: (!) 149/85 Pulse Rate: (!) 113 COWS:    Allergies:  Allergies  Allergen Reactions  . Morphine And Related Other (See Comments)    hallucinations    Home Medications:  (Not in a hospital admission)  OB/GYN Status:  Patient's last menstrual period was 12/01/2016.  General Assessment Data Location of Assessment: [facility] ED TTS Assessment: In system Is this a Tele or Face-to-Face Assessment?: Face-to-Face Is this an Initial Assessment or a Re-assessment for this encounter?: Initial Assessment Marital status: Single Maiden name: na Is patient pregnant?: No Pregnancy Status: No Living Arrangements: Alone Can pt return to current living arrangement?: Yes Admission Status: Voluntary Is patient capable of  signing voluntary admission?: No Referral Source: Self/Family/Friend Insurance type: None  Medical Screening Exam ([place]) Medical Exam completed: Yes  Crisis Care Plan Living Arrangements: Alone Legal Guardian: Other: (self) Name of Psychiatrist: unknown Name of Therapist: unknown  Education Status Is patient currently in school?: No Current Grade: na Highest grade of school patient has completed: some college Name of school: na Contact person: na  Risk to self with the past 6 months Suicidal Ideation: No Has patient been a risk to self within the past 6 months prior to admission? : No Suicidal Intent: No Has patient had any suicidal intent within the past 6 months prior to admission? : No Is patient at risk for suicide?: No Suicidal Plan?: No Has patient had any suicidal plan within the past 6 months prior to admission? : No Access to Means: No What has been your use of drugs/alcohol within the last 12 months?: None reported by collateral Previous Attempts/Gestures: No How many times?: 0 Other Self Harm Risks: none identified Triggers for Past Attempts: None known Intentional Self Injurious Behavior: None Family Suicide History: Unknown Recent stressful life event(s): Other (Comment) Persecutory voices/beliefs?: No Substance abuse history and/or treatment for substance abuse?: No Suicide prevention information given to non-admitted patients: Not applicable  Risk to Others within the past 6 months Homicidal Ideation: No Does patient have any lifetime risk of violence toward others beyond the six months prior to admission? : No Thoughts of Harm to Others: No Current Homicidal Intent: No Current Homicidal Plan: No Access to Homicidal Means: No Identified Victim: none identified History of harm to others?: No Assessment  of Violence: None Noted Violent Behavior Description: none identified Does patient have access to weapons?: No Criminal Charges Pending?:  No Does patient have a court date: No Is patient on probation?: No  Psychosis Hallucinations: None noted Delusions: Unspecified  Mental Status Report Appearance/Hygiene: In scrubs Eye Contact: Poor Motor Activity: Unremarkable Speech: Unable to assess Level of Consciousness: Unable to assess Mood: Other (Comment) Affect: Unable to Assess Anxiety Level: None Judgement: Unable to Assess Orientation: Not oriented Obsessive Compulsive Thoughts/Behaviors: Unable to Assess  Cognitive Functioning Concentration: Unable to Assess Memory: Unable to Assess IQ: Average Insight: Unable to Assess Impulse Control: Unable to Assess Sleep: Unable to Assess Vegetative Symptoms: None  ADLScreening [facility] Assessment Services) Patient's cognitive ability adequate to safely complete daily activities?: Yes Patient able to express need for assistance with ADLs?: Yes Independently performs ADLs?: Yes (appropriate for developmental age)  Prior Inpatient Therapy Prior Inpatient Therapy: No Prior Therapy Dates: na Prior Therapy Facilty/Provider(s): na Reason for Treatment: na  Prior Outpatient Therapy Prior Outpatient Therapy: No Prior Therapy Dates: na Prior Therapy Facilty/Provider(s): na Reason for Treatment: na Does patient have an ACCT team?: No Does patient have Intensive In-House Services?  : No Does patient have [facility] services? : No Does patient have P4CC services?: No  ADL Screening (condition at time of admission) Patient's cognitive ability adequate to safely complete daily activities?: Yes Patient able to express need for assistance with ADLs?: Yes Independently performs ADLs?: Yes (appropriate for developmental age)       Abuse/Neglect Assessment (Assessment to be complete while patient is alone) Physical Abuse: Denies Verbal Abuse: Denies Sexual Abuse: Denies Exploitation of patient/patient's resources: Denies Self-Neglect: Denies Values / Beliefs Cultural Requests  During Hospitalization: None Spiritual Requests During Hospitalization: None Consults Spiritual Care Consult Needed: No Social Work Consult Needed: No Regulatory affairs officer (For Healthcare) Does Patient Have a Medical Advance Directive?: No Would patient like information on creating a medical advance directive?: No - Patient declined    Additional Information 1:1 In Past 12 Months?: No CIRT Risk: No Elopement Risk: No Does patient have medical clearance?: Yes     Disposition:  Disposition Initial Assessment Completed for this Encounter: Yes Disposition of Patient: Other dispositions Other disposition(s): Other (Comment) (Pending Psych MD consult)  On Site Evaluation by:   Reviewed with Physician:    Catha Nottingham Osa Fogarty 12/18/2016 3:55 AM

## 2016-12-18 NOTE — ED Notes (Signed)
Another sister visiting with patient

## 2016-12-18 NOTE — ED Notes (Addendum)
Patient unable to focus on the phone call

## 2016-12-18 NOTE — ED Notes (Signed)
Pt asked if she would like a visitor from here sister that is currently in the lobby. Pt is willing to have a visitor, ED tech Felicia at bedside to facilitate visit.

## 2016-12-20 LAB — BLOOD GAS, ARTERIAL
Acid-Base Excess: 2.8 mmol/L — ABNORMAL HIGH (ref 0.0–2.0)
BICARBONATE: 26.1 mmol/L (ref 20.0–28.0)
FIO2: 0.21
O2 SAT: 98.7 %
PATIENT TEMPERATURE: 37
PO2 ART: 112 mmHg — AB (ref 83.0–108.0)
pCO2 arterial: 35 mmHg (ref 32.0–48.0)
pH, Arterial: 7.48 — ABNORMAL HIGH (ref 7.350–7.450)

## 2017-10-03 ENCOUNTER — Emergency Department: Payer: Medicaid Other

## 2017-10-03 ENCOUNTER — Emergency Department
Admission: EM | Admit: 2017-10-03 | Discharge: 2017-10-03 | Disposition: A | Discharge status: Home / Self Care | Payer: Medicaid Other | Attending: Emergency Medicine | Admitting: Emergency Medicine

## 2017-10-03 ENCOUNTER — Encounter: Payer: Self-pay | Admitting: Emergency Medicine

## 2017-10-03 DIAGNOSIS — Z8571 Personal history of Hodgkin lymphoma: Secondary | ICD-10-CM | POA: Insufficient documentation

## 2017-10-03 DIAGNOSIS — M25562 Pain in left knee: Secondary | ICD-10-CM | POA: Diagnosis present

## 2017-10-03 DIAGNOSIS — Z79899 Other long term (current) drug therapy: Secondary | ICD-10-CM | POA: Diagnosis not present

## 2017-10-03 MED ORDER — IBUPROFEN 800 MG PO TABS: 800.0000 mg | ORAL_TABLET | Freq: Three times a day (TID) | ORAL | 0 refills | Status: DC | PRN
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
## 2017-10-03 NOTE — Discharge Instructions (Signed)
Apply ice to your knee at least 3 times a day for 15 minutes.  Use the ibuprofen as prescribed.  Use the Ace wrap as needed for additional support.  You can also buy an over-the-counter neoprene sleeve which is a soft knee brace.  Follow-up with orthopedics if you are not better in 5-7 days

## 2019-02-13 ENCOUNTER — Other Ambulatory Visit: Payer: Self-pay | Admitting: Orthopedic Surgery

## 2019-03-01 ENCOUNTER — Other Ambulatory Visit: Payer: Self-pay

## 2019-03-01 ENCOUNTER — Encounter
Admission: RE | Admit: 2019-03-01 | Discharge: 2019-03-01 | Disposition: A | Discharge status: Home / Self Care | Payer: Medicaid Other | Source: Ambulatory Visit | Attending: Orthopedic Surgery | Admitting: Orthopedic Surgery

## 2019-03-01 ENCOUNTER — Encounter: Payer: Self-pay | Admitting: *Deleted

## 2019-03-01 NOTE — Patient Instructions (Signed)
Your procedure is scheduled on: 03/07/2019 Tues Report to Same Day Surgery 2nd floor medical mall [facility] Entrance-take elevator on left to 2nd floor.  Check in with surgery information desk.) To find out your arrival time please call [phone] between 1PM - 3PM on 03/06/2019 Mon  Remember: Instructions that are not followed completely may result in serious medical risk, up to and including death, or upon the discretion of your surgeon and anesthesiologist your surgery may need to be rescheduled.    _x___ 1. Do not eat food after midnight the night before your procedure. You may drink clear liquids up to 2 hours before you are scheduled to arrive at the hospital for your procedure.  Do not drink clear liquids within 2 hours of your scheduled arrival to the hospital.  Clear liquids include  --Water or Apple juice without pulp  --Clear carbohydrate beverage such as ClearFast or Gatorade  --Black Coffee or Clear Tea (No milk, no creamers, do not add anything to                  the coffee or Tea Type 1 and type 2 diabetics should only drink water.   ____Ensure clear carbohydrate drink on the way to the hospital for bariatric patients  ____Ensure clear carbohydrate drink 3 hours before surgery for Dr Dwyane Luo patients if physician instructed.   No gum chewing or hard candies.     __x__ 2. No Alcohol for 24 hours before or after surgery.   __x__3. No Smoking or e-cigarettes for 24 prior to surgery.  Do not use any chewable tobacco products for at least 6 hour prior to surgery   ____  4. Bring all medications with you on the day of surgery if instructed.    __x__ 5. Notify your doctor if there is any change in your medical condition     (cold, fever, infections).    x___6. On the morning of surgery brush your teeth with toothpaste and water.  You may rinse your mouth with mouth wash if you wish.  Do not swallow any toothpaste or mouthwash.   Do not wear jewelry, make-up,  hairpins, clips or nail polish.  Do not wear lotions, powders, or perfumes. You may wear deodorant.  Do not shave 48 hours prior to surgery. Men may shave face and neck.  Do not bring valuables to the hospital.    [facility] is not responsible for any belongings or valuables.               Contacts, dentures or bridgework may not be worn into surgery.  Leave your suitcase in the car. After surgery it may be brought to your room.  For patients admitted to the hospital, discharge time is determined by your                       treatment team.  _  Patients discharged the day of surgery will not be allowed to drive home.  You will need someone to drive you home and stay with you the night of your procedure.    Please read over the following fact sheets that you were given:   [facility] Preparing for Surgery and or MRSA Information   _x___ Take anti-hypertensive listed below, cardiac, seizure, asthma,     anti-reflux and psychiatric medicines. These include:  1. None  2.  3.  4.  5.  6.  ____Fleets enema or Magnesium Citrate as directed.  _x___ Use CHG Soap or sage wipes as directed on instruction sheet   ____ Use inhalers on the day of surgery and bring to hospital day of surgery  ____ Stop Metformin and Janumet 2 days prior to surgery.    ____ Take 1/2 of usual insulin dose the night before surgery and none on the morning     surgery.   _x___ Follow recommendations from Cardiologist, Pulmonologist or PCP regarding          stopping Aspirin, Coumadin, Plavix ,Eliquis, Effient, or Pradaxa, and Pletal.  X____Stop Anti-inflammatories such as Advil, Aleve, Ibuprofen, Motrin, Naproxen, Naprosyn, Goodies powders or aspirin products. OK to take Tylenol and                          Celebrex.   _x___ Stop supplements until after surgery.  But may continue Vitamin D, Vitamin B,       and multivitamin.   ____ Bring C-Pap to the hospital.

## 2019-03-03 ENCOUNTER — Other Ambulatory Visit: Payer: Self-pay

## 2019-03-03 ENCOUNTER — Other Ambulatory Visit
Admission: RE | Admit: 2019-03-03 | Discharge: 2019-03-03 | Disposition: A | Discharge status: Home / Self Care | Payer: 59 | Source: Ambulatory Visit | Attending: Orthopedic Surgery | Admitting: Orthopedic Surgery

## 2019-03-03 DIAGNOSIS — Z1159 Encounter for screening for other viral diseases: Secondary | ICD-10-CM | POA: Insufficient documentation

## 2019-03-03 LAB — SARS CORONAVIRUS 2 (TAT 6-24 HRS): SARS Coronavirus 2: NEGATIVE

## 2019-03-03 LAB — BASIC METABOLIC PANEL
Anion gap: 13 (ref 5–15)
BUN: 12 mg/dL (ref 6–20)
CO2: 22 mmol/L (ref 22–32)
Calcium: 8.9 mg/dL (ref 8.9–10.3)
Chloride: 104 mmol/L (ref 98–111)
Creatinine, Ser: 0.78 mg/dL (ref 0.44–1.00)
GFR calc Af Amer: >60 mL/min (ref >60)
GFR calc non Af Amer: >60 mL/min (ref >60)
Glucose, Bld: 93 mg/dL (ref 70–99)
Potassium: 3.3 mmol/L — ABNORMAL LOW (ref 3.5–5.1)
Sodium: 139 mmol/L (ref 135–145)

## 2019-03-03 LAB — CBC WITH DIFFERENTIAL/PLATELET
Abs Immature Granulocytes: 0.03 10*3/uL (ref 0.00–0.07)
Basophils Absolute: 0 10*3/uL (ref 0.0–0.1)
Basophils Relative: 1 %
Eosinophils Absolute: 0.2 10*3/uL (ref 0.0–0.5)
Eosinophils Relative: 2 %
HCT: 36.2 % (ref 36.0–46.0)
Hemoglobin: 11.6 g/dL — ABNORMAL LOW (ref 12.0–15.0)
Immature Granulocytes: 0 %
Lymphocytes Relative: 27 %
Lymphs Abs: 2.3 10*3/uL (ref 0.7–4.0)
MCH: 29.1 pg (ref 26.0–34.0)
MCHC: 32 g/dL (ref 30.0–36.0)
MCV: 90.7 fL (ref 80.0–100.0)
Monocytes Absolute: 0.6 10*3/uL (ref 0.1–1.0)
Monocytes Relative: 7 %
Neutro Abs: 5.2 10*3/uL (ref 1.7–7.7)
Neutrophils Relative %: 63 %
Platelets: 300 10*3/uL (ref 150–400)
RBC: 3.99 MIL/uL (ref 3.87–5.11)
RDW: 13.5 % (ref 11.5–15.5)
WBC: 8.2 10*3/uL (ref 4.0–10.5)
nRBC: 0 % (ref 0.0–0.2)

## 2019-03-03 LAB — PROTIME-INR
INR: 1 (ref 0.8–1.2)
Prothrombin Time: 13.2 seconds (ref 11.4–15.2)

## 2019-03-03 LAB — APTT: aPTT: 32 seconds (ref 24–36)

## 2019-03-06 MED ORDER — CEFAZOLIN SODIUM-DEXTROSE 2-4 GM/100ML-% IV SOLN
2.0000 g | INTRAVENOUS | Status: AC
  Administered 2019-03-07: 08:00:00 2 g via INTRAVENOUS

## 2019-03-07 ENCOUNTER — Ambulatory Visit: Payer: 59 | Admitting: Anesthesiology

## 2019-03-07 ENCOUNTER — Encounter: Payer: Self-pay | Admitting: *Deleted

## 2019-03-07 ENCOUNTER — Encounter
Admission: RE | Disposition: A | Discharge status: Home / Self Care | Payer: Self-pay | Source: Home / Self Care | Attending: Orthopedic Surgery

## 2019-03-07 ENCOUNTER — Other Ambulatory Visit: Payer: Self-pay

## 2019-03-07 ENCOUNTER — Ambulatory Visit
Admission: RE | Admit: 2019-03-07 | Discharge: 2019-03-07 | Disposition: A | Discharge status: Home / Self Care | Payer: 59 | Attending: Orthopedic Surgery | Admitting: Orthopedic Surgery

## 2019-03-07 DIAGNOSIS — M228X1 Other disorders of patella, right knee: Secondary | ICD-10-CM | POA: Insufficient documentation

## 2019-03-07 DIAGNOSIS — S83511A Sprain of anterior cruciate ligament of right knee, initial encounter: Secondary | ICD-10-CM | POA: Insufficient documentation

## 2019-03-07 DIAGNOSIS — Z885 Allergy status to narcotic agent status: Secondary | ICD-10-CM | POA: Insufficient documentation

## 2019-03-07 DIAGNOSIS — Z8571 Personal history of Hodgkin lymphoma: Secondary | ICD-10-CM | POA: Insufficient documentation

## 2019-03-07 DIAGNOSIS — X58XXXA Exposure to other specified factors, initial encounter: Secondary | ICD-10-CM | POA: Diagnosis not present

## 2019-03-07 HISTORY — PX: ANTERIOR CRUCIATE LIGAMENT REPAIR: SHX115

## 2019-03-07 LAB — POCT PREGNANCY, URINE: Preg Test, Ur: NEGATIVE

## 2019-03-07 SURGERY — RECONSTRUCTION, KNEE, ACL
Anesthesia: General | Laterality: Right

## 2019-03-07 MED ORDER — PROPOFOL 10 MG/ML IV BOLUS
INTRAVENOUS | Status: AC
  Filled 2019-03-07: qty 20

## 2019-03-07 MED ORDER — LIDOCAINE HCL (PF) 1 % IJ SOLN
INTRAMUSCULAR | Status: AC
  Filled 2019-03-07: qty 30

## 2019-03-07 MED ORDER — FENTANYL CITRATE (PF) 100 MCG/2ML IJ SOLN
INTRAMUSCULAR | Status: AC
  Administered 2019-03-07: 25 ug via INTRAVENOUS
  Filled 2019-03-07: qty 2

## 2019-03-07 MED ORDER — ACETAMINOPHEN 10 MG/ML IV SOLN
INTRAVENOUS | Status: DC | PRN
  Administered 2019-03-07: 1000 mg via INTRAVENOUS

## 2019-03-07 MED ORDER — LIDOCAINE HCL 1 % IJ SOLN
INTRAMUSCULAR | Status: DC | PRN
  Administered 2019-03-07: 4 mL

## 2019-03-07 MED ORDER — ASPIRIN EC 325 MG PO TBEC: 325.0000 mg | DELAYED_RELEASE_TABLET | Freq: Every day | ORAL | 0 refills | Status: DC

## 2019-03-07 MED ORDER — OXYCODONE HCL 5 MG/5ML PO SOLN: 5.0000 mg | Freq: Once | ORAL | Status: AC | PRN

## 2019-03-07 MED ORDER — PROMETHAZINE HCL 25 MG/ML IJ SOLN
12.5000 mg | Freq: Four times a day (QID) | INTRAMUSCULAR | Status: DC | PRN
  Administered 2019-03-07: 12.5 mg via INTRAVENOUS

## 2019-03-07 MED ORDER — HYDROMORPHONE HCL 1 MG/ML IJ SOLN
0.5000 mg | INTRAMUSCULAR | Status: AC | PRN
  Administered 2019-03-07 (×4): 0.5 mg via INTRAVENOUS

## 2019-03-07 MED ORDER — LACTATED RINGERS IV SOLN
INTRAVENOUS | Status: DC | PRN
  Administered 2019-03-07: 1 mL

## 2019-03-07 MED ORDER — DEXAMETHASONE SODIUM PHOSPHATE 10 MG/ML IJ SOLN
INTRAMUSCULAR | Status: AC
  Filled 2019-03-07: qty 1

## 2019-03-07 MED ORDER — HYDROMORPHONE HCL 1 MG/ML IJ SOLN
INTRAMUSCULAR | Status: AC
  Administered 2019-03-07: 0.5 mg via INTRAVENOUS
  Filled 2019-03-07: qty 1

## 2019-03-07 MED ORDER — BUPIVACAINE HCL (PF) 0.25 % IJ SOLN
INTRAMUSCULAR | Status: AC
  Filled 2019-03-07: qty 30

## 2019-03-07 MED ORDER — SODIUM CHLORIDE FLUSH 0.9 % IV SOLN
INTRAVENOUS | Status: AC
  Filled 2019-03-07: qty 10

## 2019-03-07 MED ORDER — HYDROMORPHONE HCL 1 MG/ML IJ SOLN
INTRAMUSCULAR | Status: AC
  Administered 2019-03-07: 12:00:00 0.5 mg via INTRAVENOUS
  Filled 2019-03-07: qty 1

## 2019-03-07 MED ORDER — CHLORHEXIDINE GLUCONATE CLOTH 2 % EX PADS: 6.0000 | MEDICATED_PAD | Freq: Once | CUTANEOUS | Status: DC

## 2019-03-07 MED ORDER — OXYCODONE HCL 5 MG PO TABS
ORAL_TABLET | ORAL | Status: AC
  Filled 2019-03-07: qty 1

## 2019-03-07 MED ORDER — SEVOFLURANE IN SOLN
RESPIRATORY_TRACT | Status: AC
  Filled 2019-03-07: qty 250

## 2019-03-07 MED ORDER — ONDANSETRON HCL 4 MG/2ML IJ SOLN
INTRAMUSCULAR | Status: DC | PRN
  Administered 2019-03-07: 4 mg via INTRAVENOUS

## 2019-03-07 MED ORDER — FAMOTIDINE 20 MG PO TABS
20.0000 mg | ORAL_TABLET | Freq: Once | ORAL | Status: AC
  Administered 2019-03-07: 20 mg via ORAL

## 2019-03-07 MED ORDER — LIDOCAINE HCL (CARDIAC) PF 100 MG/5ML IV SOSY
PREFILLED_SYRINGE | INTRAVENOUS | Status: DC | PRN
  Administered 2019-03-07: 80 mg via INTRAVENOUS

## 2019-03-07 MED ORDER — FENTANYL CITRATE (PF) 100 MCG/2ML IJ SOLN
25.0000 ug | INTRAMUSCULAR | Status: DC | PRN
  Administered 2019-03-07 (×3): 25 ug via INTRAVENOUS

## 2019-03-07 MED ORDER — ACETAMINOPHEN 10 MG/ML IV SOLN
INTRAVENOUS | Status: AC
  Filled 2019-03-07: qty 100

## 2019-03-07 MED ORDER — FENTANYL CITRATE (PF) 250 MCG/5ML IJ SOLN
INTRAMUSCULAR | Status: AC
  Filled 2019-03-07: qty 5

## 2019-03-07 MED ORDER — MIDAZOLAM HCL 2 MG/2ML IJ SOLN: 1.0000 mg | Freq: Once | INTRAMUSCULAR | Status: DC

## 2019-03-07 MED ORDER — PROMETHAZINE HCL 25 MG/ML IJ SOLN
INTRAMUSCULAR | Status: AC
  Administered 2019-03-07: 12.5 mg via INTRAVENOUS
  Filled 2019-03-07: qty 1

## 2019-03-07 MED ORDER — EPINEPHRINE (ANAPHYLAXIS) 30 MG/30ML IJ SOLN
INTRAMUSCULAR | Status: AC
  Filled 2019-03-07: qty 30

## 2019-03-07 MED ORDER — LACTATED RINGERS IV SOLN
INTRAVENOUS | Status: DC
  Administered 2019-03-07: 08:00:00 via INTRAVENOUS

## 2019-03-07 MED ORDER — BUPIVACAINE HCL (PF) 0.25 % IJ SOLN
INTRAMUSCULAR | Status: DC | PRN
  Administered 2019-03-07: 30 mL

## 2019-03-07 MED ORDER — CEFAZOLIN SODIUM-DEXTROSE 2-4 GM/100ML-% IV SOLN
INTRAVENOUS | Status: AC
  Filled 2019-03-07: qty 100

## 2019-03-07 MED ORDER — ONDANSETRON HCL 4 MG PO TABS: 4.0000 mg | ORAL_TABLET | Freq: Three times a day (TID) | ORAL | 0 refills | Status: DC | PRN

## 2019-03-07 MED ORDER — DEXAMETHASONE SODIUM PHOSPHATE 10 MG/ML IJ SOLN
INTRAMUSCULAR | Status: DC | PRN
  Administered 2019-03-07: 10 mg via INTRAVENOUS

## 2019-03-07 MED ORDER — OXYCODONE HCL 5 MG PO TABS: 5.0000 mg | ORAL_TABLET | ORAL | 0 refills | Status: DC | PRN

## 2019-03-07 MED ORDER — MIDAZOLAM HCL 2 MG/2ML IJ SOLN
INTRAMUSCULAR | Status: DC | PRN
  Administered 2019-03-07: 2 mg via INTRAVENOUS

## 2019-03-07 MED ORDER — OXYCODONE HCL 5 MG PO TABS
5.0000 mg | ORAL_TABLET | Freq: Once | ORAL | Status: AC | PRN
  Administered 2019-03-07: 5 mg via ORAL

## 2019-03-07 MED ORDER — FENTANYL CITRATE (PF) 100 MCG/2ML IJ SOLN
INTRAMUSCULAR | Status: DC | PRN
  Administered 2019-03-07: 25 ug via INTRAVENOUS
  Administered 2019-03-07 (×4): 50 ug via INTRAVENOUS
  Administered 2019-03-07: 25 ug via INTRAVENOUS

## 2019-03-07 MED ORDER — PROPOFOL 10 MG/ML IV BOLUS
INTRAVENOUS | Status: DC | PRN
  Administered 2019-03-07: 160 mg via INTRAVENOUS
  Administered 2019-03-07: 40 mg via INTRAVENOUS

## 2019-03-07 MED ORDER — MIDAZOLAM HCL 2 MG/2ML IJ SOLN
INTRAMUSCULAR | Status: AC
  Filled 2019-03-07: qty 2

## 2019-03-07 MED ORDER — FAMOTIDINE 20 MG PO TABS
ORAL_TABLET | ORAL | Status: AC
  Administered 2019-03-07: 06:00:00 20 mg via ORAL
  Filled 2019-03-07: qty 1

## 2019-03-07 MED ORDER — NEOMYCIN-POLYMYXIN B GU 40-200000 IR SOLN
Status: DC | PRN
  Administered 2019-03-07: 4 mL

## 2019-03-07 MED ORDER — NEOMYCIN-POLYMYXIN B GU 40-200000 IR SOLN
Status: AC
  Filled 2019-03-07: qty 20

## 2019-03-07 MED ORDER — ONDANSETRON HCL 4 MG/2ML IJ SOLN
INTRAMUSCULAR | Status: AC
  Filled 2019-03-07: qty 2

## 2019-03-07 SURGICAL SUPPLY — 99 items
ACL TIGHTROPE RT ×1 IMPLANT
ADAPTER IRRIG TUBE 2 SPIKE SOL (ADAPTER) ×4 IMPLANT
ANCHOR SUPER #2 ORTHOCORD (MISCELLANEOUS) IMPLANT
BASIN GRAD PLASTIC 32OZ STRL (MISCELLANEOUS) ×2 IMPLANT
BIT DRILL PIN RETRO (DRILL) IMPLANT
BLADE SURG 15 STRL LF DISP TIS (BLADE) ×1 IMPLANT
BLADE SURG 15 STRL SS (BLADE) ×1
BLADE SURG SZ11 CARB STEEL (BLADE) ×2 IMPLANT
BNDG COHESIVE 4X5 TAN STRL (GAUZE/BANDAGES/DRESSINGS) ×2 IMPLANT
BNDG COHESIVE 6X5 TAN STRL LF (GAUZE/BANDAGES/DRESSINGS) ×2 IMPLANT
BNDG ESMARK 6X12 TAN STRL LF (GAUZE/BANDAGES/DRESSINGS) IMPLANT
BRACE KNEE POST OP SHORT (BRACE) ×1 IMPLANT
BUR RADIUS 3.5 (BURR) ×2 IMPLANT
BUR RADIUS 4.0X18.5 (BURR) ×2 IMPLANT
BUR RADIUS 5.5 (BURR) ×2 IMPLANT
CLEANER CAUTERY TIP 5X5 PAD (MISCELLANEOUS) ×1 IMPLANT
COOLER POLAR GLACIER W/PUMP (MISCELLANEOUS) ×2 IMPLANT
COVER BACK TABLE REUSABLE LG (DRAPES) ×2 IMPLANT
COVER WAND RF STERILE (DRAPES) ×2 IMPLANT
CUFF TOURN SGL QUICK 24 (TOURNIQUET CUFF)
CUFF TOURN SGL QUICK 30 (TOURNIQUET CUFF)
CUFF TRNQT CYL 24X4X16.5-23 (TOURNIQUET CUFF) IMPLANT
CUFF TRNQT CYL 30X4X21-28X (TOURNIQUET CUFF) IMPLANT
CUTTER 11.0RETRO DUAL (INSTRUMENTS) ×1 IMPLANT
DRAPE 3/4 80X56 (DRAPES) ×4 IMPLANT
DRAPE FLUOR MINI C-ARM 54X84 (DRAPES) ×2 IMPLANT
DRAPE INCISE IOBAN 66X45 STRL (DRAPES) IMPLANT
DRAPE POUCH INSTRU U-SHP 10X18 (DRAPES) ×2 IMPLANT
DRAPE SHEET LG 3/4 BI-LAMINATE (DRAPES) ×1 IMPLANT
DRAPE SPLIT 6X30 W/TAPE (DRAPES) ×4 IMPLANT
DRAPE U-SHAPE 47X51 STRL (DRAPES) ×2 IMPLANT
DRILL FLIPCUTTER II 10MM (CUTTER) IMPLANT
DRILL PIN RETRO (DRILL) ×2
DURAPREP 26ML APPLICATOR (WOUND CARE) ×6 IMPLANT
ELECT REM PT RETURN 9FT ADLT (ELECTROSURGICAL) ×2
ELECTRODE REM PT RTRN 9FT ADLT (ELECTROSURGICAL) ×1 IMPLANT
FLIPCUTTER II 10MM (CUTTER) ×2
GAUZE SPONGE 4X4 12PLY STRL (GAUZE/BANDAGES/DRESSINGS) ×2 IMPLANT
GAUZE XEROFORM 1X8 LF (GAUZE/BANDAGES/DRESSINGS) ×2 IMPLANT
GLOVE BIOGEL PI IND STRL 9 (GLOVE) ×1 IMPLANT
GLOVE BIOGEL PI INDICATOR 9 (GLOVE) ×1
GLOVE SURG 9.0 ORTHO LTXF (GLOVE) ×4 IMPLANT
GOWN STRL REUS TWL 2XL XL LVL4 (GOWN DISPOSABLE) ×2 IMPLANT
GOWN STRL REUS W/ TWL LRG LVL3 (GOWN DISPOSABLE) ×1 IMPLANT
GOWN STRL REUS W/TWL LRG LVL3 (GOWN DISPOSABLE) ×1
GRADUATE 1200CC STRL 31836 (MISCELLANEOUS) ×2 IMPLANT
GUIDEWIRE 1.2MMX18 (WIRE) ×3 IMPLANT
HANDLE YANKAUER SUCT BULB TIP (MISCELLANEOUS) ×2 IMPLANT
IV LACTATED RINGER IRRG 3000ML (IV SOLUTION) ×8
IV LR IRRIG 3000ML ARTHROMATIC (IV SOLUTION) ×6 IMPLANT
KIT TURNOVER KIT A (KITS) ×2 IMPLANT
LABEL OR SOLS (LABEL) ×2 IMPLANT
MANIFOLD NEPTUNE II (INSTRUMENTS) ×2 IMPLANT
MAT ABSORB  FLUID 56X50 GRAY (MISCELLANEOUS) ×2
MAT ABSORB FLUID 56X50 GRAY (MISCELLANEOUS) ×2 IMPLANT
NDL FILTER BLUNT 18X1 1/2 (NEEDLE) ×1 IMPLANT
NDL MAYO CATGUT SZ5 (NEEDLE) ×1
NDL SAFETY ECLIPSE 18X1.5 (NEEDLE) ×1 IMPLANT
NDL SUT 5 .5 CRC TROC PNT MAYO (NEEDLE) IMPLANT
NEEDLE FILTER BLUNT 18X 1/2SAF (NEEDLE) ×1
NEEDLE FILTER BLUNT 18X1 1/2 (NEEDLE) ×1 IMPLANT
NEEDLE HYPO 18GX1.5 SHARP (NEEDLE) ×1
NEEDLE HYPO 22GX1.5 SAFETY (NEEDLE) ×2 IMPLANT
PACK ARTHROSCOPY KNEE (MISCELLANEOUS) ×2 IMPLANT
PAD ABD DERMACEA PRESS 5X9 (GAUZE/BANDAGES/DRESSINGS) ×4 IMPLANT
PAD CLEANER CAUTERY TIP 5X5 (MISCELLANEOUS) ×1
PAD WRAPON POLAR KNEE (MISCELLANEOUS) ×1 IMPLANT
PENCIL ELECTRO HAND CTR (MISCELLANEOUS) ×2 IMPLANT
SCREW INTERFERENCE CANN 11X28M (Screw) ×1 IMPLANT
SET TUBE SUCT SHAVER OUTFL 24K (TUBING) ×2 IMPLANT
SET TUBE TIP INTRA-ARTICULAR (MISCELLANEOUS) ×2 IMPLANT
SPONGE LAP 18X18 RF (DISPOSABLE) ×1 IMPLANT
STAPLE SPIKE LIGAMENT 11X20MM (Staple) ×1 IMPLANT
STRIP CLOSURE SKIN 1/2X4 (GAUZE/BANDAGES/DRESSINGS) ×4 IMPLANT
SUCTION FRAZIER HANDLE 10FR (MISCELLANEOUS) ×1
SUCTION TUBE FRAZIER 10FR DISP (MISCELLANEOUS) ×1 IMPLANT
SUT 2 FIBERLOOP 20 STRT BLUE (SUTURE) ×8
SUT ETHILON 4-0 (SUTURE) ×2
SUT ETHILON 4-0 FS2 18XMFL BLK (SUTURE) ×2
SUT FIBERSNARE 2 CLSD LOOP (SUTURE) ×1 IMPLANT
SUT FIBERWIRE #2 38 T-5 BLUE (SUTURE) ×8
SUT MNCRL AB 4-0 PS2 18 (SUTURE) ×2 IMPLANT
SUT ORTHOCORD 2X36 W/O NDL (SUTURE) IMPLANT
SUT VIC AB 0 CT1 36 (SUTURE) ×2 IMPLANT
SUT VIC AB 2-0 CT2 27 (SUTURE) IMPLANT
SUT VIC AB 2-0 SH 27 (SUTURE) ×1
SUT VIC AB 2-0 SH 27XBRD (SUTURE) ×1 IMPLANT
SUTURE 2 FIBERLOOP 20 STRT BLU (SUTURE) ×2 IMPLANT
SUTURE ETHLN 4-0 FS2 18XMF BLK (SUTURE) ×1 IMPLANT
SUTURE FIBERWR #2 38 T-5 BLUE (SUTURE) ×2 IMPLANT
SYR 10ML LL (SYRINGE) ×4 IMPLANT
SYR BULB IRRIG 60ML STRL (SYRINGE) ×2 IMPLANT
TAPE UMBIL 1/8X18 RADIOPA (MISCELLANEOUS) ×2 IMPLANT
TIGHTROPE ACL ×1 IMPLANT
TOWEL OR 17X26 4PK STRL BLUE (TOWEL DISPOSABLE) ×3 IMPLANT
TUBING ARTHRO INFLOW-ONLY STRL (TUBING) ×2 IMPLANT
TUBING CONNECTING 10 (TUBING) IMPLANT
WAND HAND CNTRL MULTIVAC 90 (MISCELLANEOUS) ×2 IMPLANT
WRAPON POLAR PAD KNEE (MISCELLANEOUS) ×2

## 2019-03-07 NOTE — H&P (Addendum)
PREOPERATIVE H&P  Chief Complaint: Right knee ACL tear  HPI: Jill Rivers is a 28 y.o. female who presents for preoperative history and physical with a diagnosis of S83.511A right knee ACL tear. Symptoms of pain and instability are significantly impairing activities of daily living.  MRI confirmed a tear of the ACL without concomitant meniscal tears.  Patient has elected to proceed with surgical reconstruction of her ACL.   Past Medical History:  Diagnosis Date  . Cancer ([place])   . Hodgkin's lymphoma [facility])    Past Surgical History:  Procedure Laterality Date  . LYMPH NODE BIOPSY    . PORT-A-CATH REMOVAL     Social History   Socioeconomic History  . Marital status: Single    Spouse name: Not on file  . Number of children: Not on file  . Years of education: Not on file  . Highest education level: Not on file  Occupational History  . Not on file  Social Needs  . Financial resource strain: Not on file  . Food insecurity    Worry: Not on file    Inability: Not on file  . Transportation needs    Medical: Not on file    Non-medical: Not on file  Tobacco Use  . Smoking status: Never Smoker  . Smokeless tobacco: Never Used  Substance and Sexual Activity  . Alcohol use: Yes  . Drug use: No  . Sexual activity: Yes  Lifestyle  . Physical activity    Days per week: Not on file    Minutes per session: Not on file  . Stress: Not on file  Relationships  . Social Herbalist on phone: Not on file    Gets together: Not on file    Attends religious service: Not on file    Active member of club or organization: Not on file    Attends meetings of clubs or organizations: Not on file    Relationship status: Not on file  Other Topics Concern  . Not on file  Social History Narrative  . Not on file   History reviewed. No pertinent family history. Allergies  Allergen Reactions  . Morphine And Related Other (See Comments)    hallucinations   Prior to Admission  medications   Medication Sig Start Date End Date Taking? Authorizing Provider  ibuprofen (ADVIL) 600 MG tablet Take 600 mg by mouth 2 (two) times daily as needed for moderate pain.   Yes [provider]     Positive ROS: All other systems have been reviewed and were otherwise negative with the exception of those mentioned in the HPI and as above.  Physical Exam: General: Alert, no acute distress Cardiovascular: Regular rate and rhythm, no murmurs rubs or gallops.  No pedal edema Respiratory: Clear to auscultation bilaterally, no wheezes rales or rhonchi. No cyanosis, no use of accessory musculature GI: No organomegaly, abdomen is soft and non-tender nondistended with positive bowel sounds. Skin: Skin intact, no lesions within the operative field. Neurologic: Sensation intact distally Psychiatric: Patient is competent for consent with normal mood and affect Lymphatic: No cervical lymphadenopathy  MUSCULOSKELETAL: Right lower extremity: Patient skin is intact.  There is no erythema ecchymosis or significant effusion.  Patient's range of motion is from 0 to 120 degrees.  She has a positive Lachman's and anterior drawer test.  She has no joint line tenderness.  She has 5-5 strength in all muscle groups, intact sensation light touch and palpable pedal pulses.  Assessment:  S83.511A sprain of anterior cruciate ligamnet of right knee  Plan: Plan for Procedure(s): RIGHT KNEE  ANTERIOR CRUCIATE LIGAMENT (ACL) RECONSTRUCTION WITH HAMSTRING AUTOGRAFT  I discussed with the patient the details of the operation as well as the postoperative course.  I also discussed the risks and benefits of surgery. The patient understands the risks include but are not limited to infection, bleeding , nerve or blood vessel injury, joint stiffness or loss of motion, persistent pain, weakness or instability, the need to use an allograft if her autograft tendons ruptured during harvest or are too small to use,  re-tear of the ACL and hardware failure and the need for further surgery. Medical risks include but are not limited to DVT and pulmonary embolism, myocardial infarction, stroke, pneumonia, respiratory failure and death. Patient understood these risks and wished to proceed.     Thornton Park, MD   03/07/2019 7:53 AM

## 2019-03-07 NOTE — Anesthesia Post-op Follow-up Note (Signed)
Anesthesia QCDR form completed.        

## 2019-03-07 NOTE — Progress Notes (Signed)
Pt has morphine listed as an allergy. Pt confirmed it caused hallucinations when she took it in 4th grade when she had cancer. I spoke with Dr. Andree Elk to relay this information. Per Dr. Andree Elk, ok to proceed with dilaudid dosage.

## 2019-03-07 NOTE — Op Note (Signed)
03/07/2019  11:35 AM  PATIENT:  Jill Rivers    PRE-OPERATIVE DIAGNOSIS:  Right knee anterior cruciate ligament tear  POST-OPERATIVE DIAGNOSIS:  Same with extensive patella chondrosis  PROCEDURE:  RIGHT KNEE ARTHROSCOPIC CHONDROPLASTY OF THE PATELLA AND ANTERIOR CRUCIATE LIGAMENT RECONSTRUCTION WITH HAMSTRING AUTOOGRAFT  SURGEON:  Thornton Park, MD  ANESTHESIA:   General  EBL:  Minimal  PREOPERATIVE INDICATIONS:  Jill Rivers is a  28 y.o. female with a diagnosis of right knee ACL tear confirmed by MRI, who failed conservative measures and elected for surgical management after having persistent right knee instability.    The risks benefits and alternatives were discussed with the patient preoperatively including but not limited to the risks of infection, bleeding, nerve or blood vessel injury, knee stiffness/arthrofibrosis, hardware failure, re-tear of the anterior cruciate ligament graft, persistent pain or instability, osteoarthritis and the need for revision surgery.  Medical risks include but are not limited to DVT and pulmonary embolism, stroke, pneumonia, respiratory failure and death. Patient understood these risks and wished to proceed with surgical reconstruction.   OPERATIVE IMPLANTS: Arthrex anterior cruciate ligament tightrope RC, Artherex biocomposite 11 x 28 mm tibial interference screw and 11 mm spiked staple.  OPERATIVE FINDINGS: Full-thickness ACL tear from the femoral origin.  There was no medial or lateral meniscal tears.  There were no focal chondral lesions of the medial or lateral compartments.  Patient had however extensive chondrosis of the undersurface of the patella with diffuse softening and fraying of the cartilage.  OPERATIVE PROCEDURE: Patient was in the preoperative area.  A preop history and physical was performed at the bedside.  The right knee was marked with the word yes according the hospital's correct site of surgery protocol. The patient was  brought to the operating room and placed in the supine position. General anesthesia was administered.  2 g of Ancef were given for antibiotic prophylaxis. The lower extremity was prepped and draped in usual sterile fashion.   A time out was performed to verify the patient's name, date of birth, medical record number, correct site of surgery correct procedure to be performed. It was also used to verify the patient received antibiotics and all appropriate instruments, implants and radiographs studies were available in the room. Once all in attendance were in agreement case began. A tourniquet was applied to the right upper thigh but was not inflated.  Exam under anesthesia was performed which demonstrated increased anterior laxity on Lachman's and anterior drawer tests.  She had a positive pivot shift.   Patient had no instability to varus valgus stress testing at 0 and 30 of flexion.  Her knee range of motion was from 0 120. She did not have a significant effusion.   Proposed arthroscopy incisions were drawn out with a surgical marker and pre-injected with 1% lidocaine plain. An 11 blade was used to establish an inferior medial and lateral portals. The medial portal was created under direct visualization using an 18-gauge spinal needle for localization. A full diagnostic examination of the knee was performed including the suprapatellar pouch, the patella femoral joint, medial lateral gutters, the medial and lateral compartments, the intercondylar notch in the posterior knee. The anterior cruciate ligament fibers were debrided with a 4.0 resector shaver blade and 90 degree ArthroCare wand. A 5.5 resector shaver blade was used perform a notchplasty. Once the intercondylar notch was prepped the attention was turned to harvesting the hamstring autografts.  A longitudinal incision was made over the anteromedial proximal tibia.  The sartorius fascia was incised with a 15 blade and reflected to reveal the  underlying gracilis and semitendinosus. These were harvested using a tendon stripper. They were prepared on the back table. The graft was measured to be 10 mm on the femoral side and 54mm on the tibial side. The length of the graft was 230 mm. The graft was placed on the Graftmaster table under 15 mmHg of tension and kept moist on the back table until implantation.   The attention was then turned to tunnel creation. The femoral tunnel cutting guide was then placed through the lateral portal. The arthroscope was placed in the medial portal at this point. The intercondylar distance was measured at 35-40 mm. A flip cutter drill guide was advanced into the intercondylar notch. The blade was engaged and the femoral tunnel was created in a retrograde fashion to 25-30 mm. A fiber stick suture was placed through the femoral tunnel brought out the lateral portal and clamped for later graft passage.   The attention was then turned to tibial tunnel creation. This was done with a fixed angle tibial retro-drill guide. A drill pin was inserted through the anterior tibia and advanced until it engaged the 11 mm drill bit. A tibial tunnel was then created in a retrograde fashion. The fiber stick was brought out through the tibial tunnel. The 4 stranded hamstring tibial autograft was then shuttled through the knee using the fiber stick graft. Once the button was flipped on the lateral femoral cortex FluoroScan image was taken to confirm it was laying flat against the lateral cortex of the femur. Once this was confirmed the hamstring graft was advanced into position using the white suture ends of the Arthrex tight rope RC button. The graft was bottomed out into the femoral tunnel. The knee was then cycled 25 times to remove creep. The knee was then flexed approximately 30. An Arthrex bio composite interference screw 11 x 28 mm was then advanced into position with countertraction on the tibial side of the graft and a posterior  drawer force directed to the tibia. Once the interference screw was in position, an 11 mm spiked staple was placed over the distal end of the graft on the tibial side as backup fixation.  The patient had a firm endpoint without anterior laxity on Lachman's test. The range of motion was 0-120. Final arthroscopic images of the graft were taken. There was no graft impingement in full extension. The wounds were copiously irrigated. The deep fascia of the anterior tibial incision was closed with interrupted 0 Vicryl.  The of the tibial incision subcutaneous tissue was closed with a 2-0 Vicryl and the skin was approximated with a running 4-0 Monocryl. The arthroscopy portal incisions were closed with 4-0 nylon along with the small stab incision over the lateral femur used for placement of the femoral tunnel.  Patient had a dry sterile dressing applied along with Steri-Strips and Xeroform. The incisions and the joint were injected with 0.25% Marcaine plain.  Patient had a Polar Care sleeve placed along with a hinged knee brace locked in extension. The patient was brought to the PACU in stable condition. I was scrubbed and present the entire case and all sharp and instrument counts were correct at conclusion the case.  The patient was awakened and brought to PACU in stable condition. I spoke with the patient's father by phone from the PACU, given COVID restrictions, to let him know that patient was stable in recovery room the case  had been performed without complication.

## 2019-03-07 NOTE — Anesthesia Postprocedure Evaluation (Signed)
Anesthesia Post Note  Patient: Jill Rivers  Procedure(s) Performed: RIGHT KNEE ARTHROSCOPY WITH ANTERIOR CRUCIATE LIGAMENT RECONSTRUCTION WITH ALLOGRAFT (Right )  Patient location during evaluation: PACU Anesthesia Type: General Level of consciousness: awake and alert Pain management: pain level controlled Vital Signs Assessment: post-procedure vital signs reviewed and stable Respiratory status: spontaneous breathing, nonlabored ventilation, respiratory function stable and patient connected to nasal cannula oxygen Cardiovascular status: blood pressure returned to baseline and stable Postop Assessment: no apparent nausea or vomiting Anesthetic complications: no     Last Vitals:  Vitals:   03/07/19 1226 03/07/19 1236  BP: 133/88 128/75  Pulse: 69 (!) 59  Resp: 14 16  Temp:  36.9 C  SpO2: 100% 99%    Last Pain:  Vitals:   03/07/19 1236  TempSrc: Temporal  PainSc: Jill Rivers

## 2019-03-07 NOTE — Discharge Instructions (Signed)

## 2019-03-07 NOTE — Transfer of Care (Signed)
Immediate Anesthesia Transfer of Care Note  Patient: Jill Rivers  Procedure(s) Performed: RIGHT KNEE ARTHROSCOPY WITH ANTERIOR CRUCIATE LIGAMENT RECONSTRUCTION WITH ALLOGRAFT (Right )  Patient Location: PACU  Anesthesia Type:General  Level of Consciousness: awake, alert  and oriented  Airway & Oxygen Therapy: Patient Spontanous Breathing  Post-op Assessment: Report given to RN, Post -op Vital signs reviewed and stable and Patient moving all extremities  Post vital signs: Reviewed and stable  Last Vitals:  Vitals Value Taken Time  BP 136/70 03/07/19 1113  Temp 36.8 C 03/07/19 1113  Pulse 84 03/07/19 1117  Resp 11 03/07/19 1117  SpO2 99 % 03/07/19 1117  Vitals shown include unvalidated device data.  Last Pain:  Vitals:   03/07/19 0613  TempSrc: Tympanic  PainSc: 4          Complications: No apparent anesthesia complications

## 2019-03-07 NOTE — Anesthesia Preprocedure Evaluation (Signed)
Anesthesia Evaluation  Patient identified by MRN, date of birth, ID band Patient awake    Reviewed: Allergy & Precautions, H&P , NPO status , Patient's Chart, lab work & pertinent test results  History of Anesthesia Complications Negative for: history of anesthetic complications  Airway Mallampati: II  TM Distance: >3 FB Neck ROM: full    Dental  (+) Chipped   Pulmonary neg pulmonary ROS, neg shortness of breath,           Cardiovascular Exercise Tolerance: Good (-) angina(-) Past MI and (-) DOE negative cardio ROS       Neuro/Psych PSYCHIATRIC DISORDERS negative neurological ROS     GI/Hepatic negative GI ROS, Neg liver ROS, neg GERD  ,  Endo/Other  negative endocrine ROS  Renal/GU      Musculoskeletal   Abdominal   Peds  Hematology negative hematology ROS (+)   Anesthesia Other Findings Past Medical History: No date: Cancer ([place]) No date: Hodgkin's lymphoma ([place])  Past Surgical History: No date: LYMPH NODE BIOPSY No date: PORT-A-CATH REMOVAL  BMI    Body Mass Index: 29.29 kg/m      Reproductive/Obstetrics negative OB ROS                             Anesthesia Physical Anesthesia Plan  ASA: II  Anesthesia Plan: General LMA   Post-op Pain Management:    Induction: Intravenous  PONV Risk Score and Plan: Dexamethasone, Ondansetron, Midazolam and Treatment may vary due to age or medical condition  Airway Management Planned: LMA  Additional Equipment:   Intra-op Plan:   Post-operative Plan: Extubation in OR  Informed Consent: I have reviewed the patients History and Physical, chart, labs and discussed the procedure including the risks, benefits and alternatives for the proposed anesthesia with the patient or authorized representative who has indicated his/her understanding and acceptance.     Dental Advisory Given  Plan Discussed with: Anesthesiologist, CRNA and  Surgeon  Anesthesia Plan Comments: (Patient consented for risks of anesthesia including but not limited to:  - adverse reactions to medications - damage to teeth, lips or other oral mucosa - sore throat or hoarseness - Damage to heart, brain, lungs or loss of life  Patient voiced understanding.)        Anesthesia Quick Evaluation

## 2019-08-31 ENCOUNTER — Ambulatory Visit: Payer: Self-pay

## 2020-04-08 ENCOUNTER — Ambulatory Visit: Payer: Medicaid Other | Attending: Internal Medicine

## 2020-04-08 DIAGNOSIS — Z23 Encounter for immunization: Secondary | ICD-10-CM

## 2020-04-08 NOTE — Progress Notes (Signed)
   Covid-19 Vaccination Clinic  Name:  Jill Rivers    MRN: [id number] DOB: Oct 07, 1990  04/08/2020  [name] was observed post Covid-19 immunization for 15 minutes without incident. She was provided with Vaccine Information Sheet and instruction to access the V-Safe system.   [name] was instructed to call 911 with any severe reactions post vaccine: Marland Kitchen Difficulty breathing  . Swelling of face and throat  . A fast heartbeat  . A bad rash all over body  . Dizziness and weakness   Immunizations Administered    Name Date Dose VIS Date Route   Pfizer COVID-19 Vaccine 04/08/2020  2:59 PM 0.3 mL 10/11/2018 Intramuscular   Manufacturer: [place]   Lot: [id number]   [place]: [id number]

## 2020-04-29 ENCOUNTER — Ambulatory Visit: Payer: Self-pay

## 2020-04-29 ENCOUNTER — Ambulatory Visit: Payer: Medicaid Other | Attending: Internal Medicine

## 2020-04-29 DIAGNOSIS — Z23 Encounter for immunization: Secondary | ICD-10-CM

## 2020-04-29 NOTE — Progress Notes (Signed)
   Covid-19 Vaccination Clinic  Name:  Evangelia Whitaker    MRN: [id number] DOB: 11-08-1990  04/29/2020  Ms. Heater was observed post Covid-19 immunization for 15 minutes without incident. She was provided with Vaccine Information Sheet and instruction to access the V-Safe system.   Ms. Macmaster was instructed to call 911 with any severe reactions post vaccine: Marland Kitchen Difficulty breathing  . Swelling of face and throat  . A fast heartbeat  . A bad rash all over body  . Dizziness and weakness   Immunizations Administered    Name Date Dose VIS Date Route   Pfizer COVID-19 Vaccine 04/29/2020  2:42 PM 0.3 mL 10/11/2018 Intramuscular   Manufacturer: [employer]   Lot: [id number]   [place]: [id number]

## 2020-08-15 ENCOUNTER — Ambulatory Visit: Payer: Medicaid Other | Admitting: Advanced Practice Midwife

## 2020-08-15 ENCOUNTER — Other Ambulatory Visit: Payer: Self-pay

## 2020-08-15 ENCOUNTER — Encounter: Payer: Self-pay | Admitting: Advanced Practice Midwife

## 2020-08-15 DIAGNOSIS — Z113 Encounter for screening for infections with a predominantly sexual mode of transmission: Secondary | ICD-10-CM | POA: Diagnosis not present

## 2020-08-15 LAB — WET PREP FOR TRICH, YEAST, CLUE
Trichomonas Exam: NEGATIVE
Yeast Exam: NEGATIVE

## 2020-08-15 NOTE — Progress Notes (Signed)
[facility] Department STI clinic/screening visit  Subjective:  Jill Rivers is a 29 y.o. SBF nonsmoker G23P4 female being seen today for an STI screening visit. The patient reports they do not have symptoms.  Patient reports that they do not desire a pregnancy in the next year.   They reported they are not interested in discussing contraception today.  No LMP recorded.   Patient has the following medical conditions:   Patient Active Problem List   Diagnosis Date Noted  . Delirium, drug-induced 12/18/2016  . Cannabis abuse 12/18/2016  . Cancer [facility])     No chief complaint on file.   HPI  Patient reports LMP early November, irregular cycles.  Mirena inserted  08/2018.  Last sex 08/07/20 without condom; with current partner x 9 years.  Last ETOH last night (1 shot Remy 1738) 1x/wk.  Last MJ 3 years ago. Strong MJ odor in exam room.  Last HIV test per patient/review of record was 2020 per pt Patient reports last pap was 05/2019 neg per pt  See flowsheet for further details and programmatic requirements.    The following portions of the patient's history were reviewed and updated as appropriate: allergies, current medications, past medical history, past social history, past surgical history and problem list.  Objective:  There were no vitals filed for this visit.  Physical Exam Vitals and nursing note reviewed.  Constitutional:      Appearance: Normal appearance.  HENT:     Head: Normocephalic and atraumatic.     Mouth/Throat:     Mouth: Mucous membranes are moist.     Pharynx: Oropharynx is clear. No oropharyngeal exudate or posterior oropharyngeal erythema.  Eyes:     Conjunctiva/sclera: Conjunctivae normal.  Pulmonary:     Effort: Pulmonary effort is normal.  Chest:  Breasts:     Right: No axillary adenopathy or supraclavicular adenopathy.     Left: No axillary adenopathy or supraclavicular adenopathy.    Abdominal:     General: Abdomen is flat.      Palpations: Abdomen is soft. There is no mass.     Tenderness: There is no abdominal tenderness. There is no rebound.     Comments: Soft without masses or tenderness  Genitourinary:    General: Normal vulva.     Exam position: Lithotomy position.     Pubic Area: No rash or pubic lice.      Labia:        Right: No rash or lesion.        Left: No rash or lesion.      Vagina: Normal. No vaginal discharge (small amt white thin leukorrhea, ph inconclusive), erythema, bleeding or lesions.     Cervix: Normal.     Uterus: Normal.      Adnexa: Right adnexa normal and left adnexa normal.     Rectum: Normal.     Comments: Mirena IUD string visible in os Lymphadenopathy:     Head:     Right side of head: No preauricular or posterior auricular adenopathy.     Left side of head: No preauricular or posterior auricular adenopathy.     Cervical: No cervical adenopathy.     Upper Body:     Right upper body: No supraclavicular or axillary adenopathy.     Left upper body: No supraclavicular or axillary adenopathy.     Lower Body: No right inguinal adenopathy. No left inguinal adenopathy.  Skin:    General: Skin is warm and dry.  Findings: No rash.  Neurological:     Mental Status: She is alert and oriented to person, place, and time.      Assessment and Plan:  EARLEEN AOUN is a 29 y.o. female presenting to the [facility] Department for STI screening  1. Screening examination for venereal disease Treat wet mount per standing orders Immunization nurse consult - WET PREP FOR [place], YEAST, CLUE - HIV/HCV [place] [facility] - Chlamydia/Gonorrhea [place] [facility] - Syphilis Serology, [place] [facility]     No follow-ups on file.  No future appointments.  Alberteen Spindle, CNM

## 2020-08-15 NOTE — Progress Notes (Signed)
Post:  RN reviewed wet mount with patient. No tx per S.O. Provider orders complete.   Aviana Shevlin, RN  

## 2020-08-22 LAB — HM HIV SCREENING LAB: HM HIV Screening: NEGATIVE

## 2020-08-25 ENCOUNTER — Encounter: Payer: Self-pay | Admitting: Student

## 2020-12-06 ENCOUNTER — Encounter: Payer: Self-pay | Admitting: Family Medicine

## 2020-12-06 ENCOUNTER — Other Ambulatory Visit: Payer: Self-pay

## 2020-12-06 ENCOUNTER — Ambulatory Visit: Payer: Medicaid Other | Admitting: Family Medicine

## 2020-12-06 DIAGNOSIS — Z113 Encounter for screening for infections with a predominantly sexual mode of transmission: Secondary | ICD-10-CM

## 2020-12-06 DIAGNOSIS — B9689 Other specified bacterial agents as the cause of diseases classified elsewhere: Secondary | ICD-10-CM

## 2020-12-06 LAB — WET PREP FOR TRICH, YEAST, CLUE
Clue Cell Exam: POSITIVE — AB
Trichomonas Exam: NEGATIVE
Yeast Exam: NEGATIVE

## 2020-12-06 MED ORDER — METRONIDAZOLE 500 MG PO TABS: 500.0000 mg | ORAL_TABLET | Freq: Two times a day (BID) | ORAL | 0 refills | Status: AC

## 2020-12-06 NOTE — Progress Notes (Signed)
Wet prep results reviewed with Provider.  Pt treated per Provider orders. Windle Guard, RN

## 2020-12-06 NOTE — Progress Notes (Signed)
[facility] Department STI clinic/screening visit  Subjective:  Jill Rivers is a 30 y.o. female being seen today for an STI screening visit. The patient reports they do have symptoms.  Patient reports that they do not desire a pregnancy in the next year.   They reported they are not interested in discussing contraception today.  Patient's last menstrual period was 11/15/2020.   Patient has the following medical conditions:   Patient Active Problem List   Diagnosis Date Noted  . Morbid obesity ([place]) 187 lbs 08/15/2020  . Delirium, drug-induced 12/18/2016  . Cannabis abuse 12/18/2016  . Cancer [facility])     Chief Complaint  Patient presents with  . SEXUALLY TRANSMITTED DISEASE    screening    HPI  Patient reports here for screening, has some vaginal symptoms.   Last HIV test per patient/review of record was 08/22/2020 Patient reports last pap was 2020  See flowsheet for further details and programmatic requirements.    The following portions of the patient's history were reviewed and updated as appropriate: allergies, current medications, past medical history, past social history, past surgical history and problem list.  Objective:  There were no vitals filed for this visit.  Physical Exam Vitals and nursing note reviewed.  Constitutional:      Appearance: Normal appearance.  HENT:     Head: Normocephalic and atraumatic.     Mouth/Throat:     Mouth: Mucous membranes are moist.     Pharynx: Oropharynx is clear. No oropharyngeal exudate or posterior oropharyngeal erythema.  Pulmonary:     Effort: Pulmonary effort is normal.  Chest:  Breasts:     Right: No axillary adenopathy or supraclavicular adenopathy.     Left: No axillary adenopathy or supraclavicular adenopathy.    Abdominal:     General: Abdomen is flat.     Palpations: There is no mass.     Tenderness: There is no abdominal tenderness. There is no rebound.  Genitourinary:    General: Normal  vulva.     Exam position: Lithotomy position.     Pubic Area: No rash or pubic lice.      Labia:        Right: No rash or lesion.        Left: No rash or lesion.      Vagina: Normal. No vaginal discharge, erythema, bleeding or lesions.     Cervix: No cervical motion tenderness, discharge, friability, lesion or erythema.     Uterus: Normal.      Adnexa: Right adnexa normal and left adnexa normal.     Rectum: Normal.     Comments: External genitalia without, lice, nits, erythema, edema , lesions or inguinal adenopathy. Vagina with normal mucosa and white thick discharge and pH > 4.  Cervix without visual lesions, uterus firm, mobile, non-tender, no masses, CMT adnexal fullness or tenderness.   Lymphadenopathy:     Head:     Right side of head: No preauricular or posterior auricular adenopathy.     Left side of head: No preauricular or posterior auricular adenopathy.     Cervical: No cervical adenopathy.     Upper Body:     Right upper body: No supraclavicular or axillary adenopathy.     Left upper body: No supraclavicular or axillary adenopathy.     Lower Body: No right inguinal adenopathy. No left inguinal adenopathy.  Skin:    General: Skin is warm and dry.     Findings: No rash.  Neurological:     Mental Status: She is alert and oriented to person, place, and time.      Assessment and Plan:  Jill Rivers is a 30 y.o. female presenting to the [facility] Department for STI screening  1. Screening examination for venereal disease - Chlamydia/Gonorrhea [place] [facility] - Syphilis Serology, [place] [facility] - WET PREP FOR [place], YEAST, [place] [facility]  Patient accepted all screenings including wet prep, vaginal CT/GC and bloodwork for HIV/RPR.  Patient meets criteria for HepB screening? Yes. Ordered? No - declined  Patient meets criteria for HepC screening? Yes. Ordered? No - declined   2. BV (bacterial vaginosis) Wet prep results pos. Clue & neg  amine Treatment needed for BV per provider assessment and patient symptoms.   Treat with Metronidazole 500 mg PO BID x 7 days.    Discussed time line for State [facility] results and that patient will be called with positive results and encouraged patient to call if she had not heard in 2 weeks.  Counseled to return or seek care for continued or worsening symptoms Recommended condom use with all sex  Patient is currently using IUD to prevent pregnancy.    No follow-ups on file.  No future appointments.  Junious Dresser, FNP

## 2020-12-12 LAB — HM HIV SCREENING LAB: HM HIV Screening: NEGATIVE

## 2021-01-23 ENCOUNTER — Emergency Department: Payer: Managed Care, Other (non HMO)

## 2021-01-23 ENCOUNTER — Emergency Department
Admission: EM | Admit: 2021-01-23 | Discharge: 2021-01-23 | Disposition: A | Discharge status: Home / Self Care | Payer: Managed Care, Other (non HMO) | Attending: Emergency Medicine | Admitting: Emergency Medicine

## 2021-01-23 ENCOUNTER — Other Ambulatory Visit: Payer: Self-pay

## 2021-01-23 DIAGNOSIS — R55 Syncope and collapse: Secondary | ICD-10-CM | POA: Diagnosis not present

## 2021-01-23 DIAGNOSIS — Z859 Personal history of malignant neoplasm, unspecified: Secondary | ICD-10-CM | POA: Diagnosis not present

## 2021-01-23 DIAGNOSIS — R4020 Unspecified coma: Secondary | ICD-10-CM

## 2021-01-23 DIAGNOSIS — M542 Cervicalgia: Secondary | ICD-10-CM | POA: Diagnosis not present

## 2021-01-23 DIAGNOSIS — S59912A Unspecified injury of left forearm, initial encounter: Secondary | ICD-10-CM | POA: Diagnosis present

## 2021-01-23 DIAGNOSIS — S50812A Abrasion of left forearm, initial encounter: Secondary | ICD-10-CM | POA: Diagnosis not present

## 2021-01-23 DIAGNOSIS — Y92410 Unspecified street and highway as the place of occurrence of the external cause: Secondary | ICD-10-CM | POA: Insufficient documentation

## 2021-01-23 MED ORDER — METHOCARBAMOL 750 MG PO TABS: 750.0000 mg | ORAL_TABLET | Freq: Four times a day (QID) | ORAL | 0 refills | Status: AC | PRN

## 2021-01-23 MED ORDER — METHOCARBAMOL 500 MG PO TABS
750.0000 mg | ORAL_TABLET | Freq: Once | ORAL | Status: AC
  Administered 2021-01-23: 750 mg via ORAL
  Filled 2021-01-23: qty 2

## 2021-01-23 MED ORDER — MELOXICAM 15 MG PO TABS: 15.0000 mg | ORAL_TABLET | Freq: Every day | ORAL | 0 refills | Status: AC

## 2021-01-23 MED ORDER — KETOROLAC TROMETHAMINE 60 MG/2ML IM SOLN
30.0000 mg | Freq: Once | INTRAMUSCULAR | Status: AC
  Administered 2021-01-23: 30 mg via INTRAMUSCULAR
  Filled 2021-01-23: qty 2

## 2021-01-23 MED ORDER — ACETAMINOPHEN 500 MG PO TABS
1000.0000 mg | ORAL_TABLET | Freq: Once | ORAL | Status: AC
  Administered 2021-01-23: 1000 mg via ORAL
  Filled 2021-01-23: qty 2
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
## 2021-01-23 NOTE — Discharge Instructions (Addendum)
Please take the meloxicam once daily as prescribed.  You may also take the Robaxin up to 4 times daily as prescribed.  Please use caution when driving or operating machinery on this medication as it has the potential to make you drowsy.  Please also use Tylenol, up to 1000 mg 4 times daily as needed for pain.  Follow-up with your primary care if you are not improving, or return to the ER for any worsening.
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
## 2021-05-08 ENCOUNTER — Other Ambulatory Visit: Payer: Self-pay

## 2021-05-08 ENCOUNTER — Encounter: Payer: Self-pay | Admitting: Physician Assistant

## 2021-05-08 ENCOUNTER — Ambulatory Visit: Payer: Medicaid Other | Admitting: Physician Assistant

## 2021-05-08 DIAGNOSIS — Z113 Encounter for screening for infections with a predominantly sexual mode of transmission: Secondary | ICD-10-CM

## 2021-05-08 LAB — WET PREP FOR TRICH, YEAST, CLUE
Trichomonas Exam: NEGATIVE
Yeast Exam: NEGATIVE

## 2021-05-08 NOTE — Progress Notes (Signed)
[facility] Department STI clinic/screening visit  Subjective:  Jill Rivers is a 30 y.o. female being seen today for an STI screening visit. The patient reports they do have symptoms.  Patient reports that they do not desire a pregnancy in the next year.   They reported they are not interested in discussing contraception today.  No LMP recorded.   Patient has the following medical conditions:   Patient Active Problem List   Diagnosis Date Noted   Morbid obesity ([place]) 187 lbs 08/15/2020   Delirium, drug-induced 12/18/2016   Cannabis abuse 12/18/2016   Cancer [facility])     Chief Complaint  Patient presents with   SEXUALLY TRANSMITTED DISEASE    screening    HPI  Patient reports that she has been having frequent urination and pressure after urination for 1 week.  Denies other symptoms, chronic conditions and regular medicines.  Reports last HIV test was 11/2020 and last pap was in 2020.  States that she has a Mirena IUD and has irregular spotting but not regular periods.   See flowsheet for further details and programmatic requirements.    The following portions of the patient's history were reviewed and updated as appropriate: allergies, current medications, past medical history, past social history, past surgical history and problem list.  Objective:  There were no vitals filed for this visit.  Physical Exam Constitutional:      General: She is not in acute distress.    Appearance: Normal appearance.  HENT:     Head: Normocephalic and atraumatic.     Comments: No nits,lice, or hair loss. No cervical, supraclavicular or axillary adenopathy.     Mouth/Throat:     Mouth: Mucous membranes are moist.     Pharynx: Oropharynx is clear. No oropharyngeal exudate or posterior oropharyngeal erythema.  Eyes:     Conjunctiva/sclera: Conjunctivae normal.  Pulmonary:     Effort: Pulmonary effort is normal.  Abdominal:     Palpations: Abdomen is soft. There is no mass.      Tenderness: There is no abdominal tenderness. There is no guarding or rebound.  Genitourinary:    General: Normal vulva.     Rectum: Normal.     Comments: External genitalia/pubic area without nits, lice, edema, erythema, lesions and inguinal adenopathy. Vagina with normal mucosa and small amount of menstrual bleeding present.. Cervix without visible lesions.  IUD strings visualized and palpated. Uterus firm, mobile, nt, no masses, no CMT, no adnexal tenderness or fullness.  Musculoskeletal:     Cervical back: Neck supple. No tenderness.  Skin:    General: Skin is warm and dry.     Findings: No bruising, erythema, lesion or rash.  Neurological:     Mental Status: She is alert and oriented to person, place, and time.  Psychiatric:        Mood and Affect: Mood normal.        Behavior: Behavior normal.        Thought Content: Thought content normal.        Judgment: Judgment normal.     Assessment and Plan:  Jill Rivers is a 30 y.o. female presenting to the [facility] Department for STI screening  1. Screening for STD (sexually transmitted disease) Patient into clinic with symptoms. Enc patient to follow  up with PCP or urgent care for evaluation for UTI. Enc to drink H2O, cranberry juice and lemonade as well as urinating when needs to and avoiding holding urine. Reviewed with  patient that wet mount is normal and no treatment is indicated today. Rec condoms with all sex. Await test results.  Counseled that RN will call if needs to RTC for treatment once results are back.  - WET PREP FOR [place], YEAST, [place] LAB - Syphilis Serology,  Lab     No follow-ups on file.  No future appointments.  Jerene Dilling, PA

## 2021-12-22 ENCOUNTER — Encounter: Payer: Self-pay | Admitting: Advanced Practice Midwife

## 2021-12-22 ENCOUNTER — Ambulatory Visit: Payer: Medicaid Other | Admitting: Advanced Practice Midwife

## 2021-12-22 DIAGNOSIS — Z113 Encounter for screening for infections with a predominantly sexual mode of transmission: Secondary | ICD-10-CM | POA: Diagnosis not present

## 2021-12-22 LAB — WET PREP FOR TRICH, YEAST, CLUE
Trichomonas Exam: NEGATIVE
Yeast Exam: NEGATIVE

## 2021-12-22 LAB — HM HIV SCREENING LAB: HM HIV Screening: NEGATIVE

## 2021-12-22 NOTE — Progress Notes (Signed)
Pt here for STD screening.  Wet mount results reviewed, no treatment required.  Windle Guard, RN ? ?

## 2021-12-22 NOTE — Progress Notes (Signed)
[facility] Department ? ?STI clinic/screening visit ?[place] [address] ?[phone] ? ?Subjective:  ?Jill Rivers is a 31 y.o. SBF exsmoker G5P4 female being seen today for an STI screening visit. The patient reports they do have symptoms.  Patient reports that they do not desire a pregnancy in the next year.   They reported they are not interested in discussing contraception today.   ? ?No LMP recorded (lmp unknown). (Menstrual status: IUD). ? ? ?Patient has the following medical conditions:   ?Patient Active Problem List  ? Diagnosis Date Noted  ? Morbid obesity ([place]) 187 lbs 08/15/2020  ? Delirium, drug-induced 12/18/2016  ? Cannabis abuse 12/18/2016  ? Cancer [facility])   ? ? ?Chief Complaint  ?Patient presents with  ? SEXUALLY TRANSMITTED DISEASE  ? ? ?HPI ? ?Patient reports white-yellow d/c since last week. Last sex 12/17/21 with condom; with current partner since 05/2021; 1 sex partner in last 3 mo. Last vaped 08/2021. Last cigar age 4. Last MJ age 26. Last ETOH 12/19/21 (1.5 pitcher [place]ita) 2x/mo. LMP 11/19/21. Has Mirena ? ?Last HIV test per patient/review of record was 05/08/21 ?Patient reports last pap was 05/2019 neg per pt ? ?Screening for MPX risk: ?Does the patient have an unexplained rash? No ?Is the patient MSM? No ?Does the patient endorse multiple sex partners or anonymous sex partners? No ?Did the patient have close or sexual contact with a person diagnosed with MPX? No ?Has the patient traveled outside the [place] where MPX is endemic? No ?Is there a high clinical suspicion for MPX-- evidenced by one of the following No ? -Unlikely to be chickenpox ? -Lymphadenopathy ? -Rash that present in same phase of evolution on any given body part ?See flowsheet for further details and programmatic requirements.  ? ?Immunization history:  ?Immunization History  ?Administered Date(s) Administered  ? Hepatitis B 04/02/92, 01/29/1992, 06/03/1992  ? Hpv-Unspecified 12/13/2008   ? PFIZER(Purple Top)SARS-COV-2 Vaccination 04/08/2020, 04/29/2020  ? Tdap 08/05/2015  ?  ? ?The following portions of the patient's history were reviewed and updated as appropriate: allergies, current medications, past medical history, past social history, past surgical history and problem list. ? ?Objective:  ?There were no vitals filed for this visit. ? ?Physical Exam ?Vitals and nursing note reviewed.  ?Constitutional:   ?   Appearance: Normal appearance. She is normal weight.  ?HENT:  ?   Head: Normocephalic and atraumatic.  ?   Mouth/Throat:  ?   Mouth: Mucous membranes are moist.  ?   Pharynx: Oropharynx is clear. No oropharyngeal exudate or posterior oropharyngeal erythema.  ?Eyes:  ?   Conjunctiva/sclera: Conjunctivae normal.  ?Pulmonary:  ?   Effort: Pulmonary effort is normal.  ?Abdominal:  ?   Palpations: Abdomen is soft. There is no mass.  ?   Tenderness: There is no abdominal tenderness. There is no rebound.  ?   Comments: Soft without masses or tenderness  ?Genitourinary: ?   General: Normal vulva.  ?   Exam position: Lithotomy position.  ?   Pubic Area: No rash or pubic lice.   ?   Labia:     ?   Right: No rash or lesion.     ?   Left: No rash or lesion.   ?   Vagina: Vaginal discharge (creamy white leukorrhea, ph<4.5) present. No erythema, bleeding or lesions.  ?   Cervix: Normal.  ?   Uterus: Normal.   ?   Adnexa: Right  adnexa normal and left adnexa normal.  ?   Rectum: Normal.  ?Lymphadenopathy:  ?   Head:  ?   Right side of head: No preauricular or posterior auricular adenopathy.  ?   Left side of head: No preauricular or posterior auricular adenopathy.  ?   Cervical: No cervical adenopathy.  ?   Right cervical: No superficial, deep or posterior cervical adenopathy. ?   Left cervical: No superficial, deep or posterior cervical adenopathy.  ?   Upper Body:  ?   Right upper body: No supraclavicular or axillary adenopathy.  ?   Left upper body: No supraclavicular or axillary adenopathy.  ?   Lower  Body: No right inguinal adenopathy. No left inguinal adenopathy.  ?Skin: ?   General: Skin is warm and dry.  ?   Findings: No rash.  ?Neurological:  ?   Mental Status: She is alert and oriented to person, place, and time.  ? ? ? ?Assessment and Plan:  ?Jill Rivers is a 31 y.o. female presenting to the [facility] Department for STI screening ? ?1. Screening examination for venereal disease ?Treat wet mount per standing orders ?Immunization nurse consult ?- WET PREP FOR [place], YEAST, CLUE ?- Chlamydia/Gonorrhea [place] [facility] ?- Syphilis Serology, [place] [facility] ?- HIV [place] [facility] ? ? ? ? ?No follow-ups on file. ? ?No future appointments. ? ?Herbie Saxon, CNM ?
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
## 2022-04-16 ENCOUNTER — Emergency Department: Payer: Medicaid Other

## 2022-04-16 ENCOUNTER — Emergency Department
Admission: EM | Admit: 2022-04-16 | Discharge: 2022-04-16 | Discharge status: Against Medical Advice | Payer: Medicaid Other | Attending: Emergency Medicine | Admitting: Emergency Medicine

## 2022-04-16 ENCOUNTER — Other Ambulatory Visit: Payer: Self-pay

## 2022-04-16 DIAGNOSIS — R519 Headache, unspecified: Secondary | ICD-10-CM | POA: Insufficient documentation

## 2022-04-16 DIAGNOSIS — R110 Nausea: Secondary | ICD-10-CM | POA: Insufficient documentation

## 2022-04-16 DIAGNOSIS — Z5321 Procedure and treatment not carried out due to patient leaving prior to being seen by health care provider: Secondary | ICD-10-CM | POA: Insufficient documentation

## 2022-04-16 LAB — CBC WITH DIFFERENTIAL/PLATELET
Abs Immature Granulocytes: 0.03 10*3/uL (ref 0.00–0.07)
Basophils Absolute: 0.1 10*3/uL (ref 0.0–0.1)
Basophils Relative: 1 %
Eosinophils Absolute: 0.1 10*3/uL (ref 0.0–0.5)
Eosinophils Relative: 1 %
HCT: 35.5 % — ABNORMAL LOW (ref 36.0–46.0)
Hemoglobin: 11.4 g/dL — ABNORMAL LOW (ref 12.0–15.0)
Immature Granulocytes: 0 %
Lymphocytes Relative: 31 %
Lymphs Abs: 2.8 10*3/uL (ref 0.7–4.0)
MCH: 29.6 pg (ref 26.0–34.0)
MCHC: 32.1 g/dL (ref 30.0–36.0)
MCV: 92.2 fL (ref 80.0–100.0)
Monocytes Absolute: 0.5 10*3/uL (ref 0.1–1.0)
Monocytes Relative: 6 %
Neutro Abs: 5.6 10*3/uL (ref 1.7–7.7)
Neutrophils Relative %: 61 %
Platelets: 273 10*3/uL (ref 150–400)
RBC: 3.85 MIL/uL — ABNORMAL LOW (ref 3.87–5.11)
RDW: 12.8 % (ref 11.5–15.5)
WBC: 9 10*3/uL (ref 4.0–10.5)
nRBC: 0 % (ref 0.0–0.2)

## 2022-04-16 LAB — BASIC METABOLIC PANEL
Anion gap: 5 (ref 5–15)
BUN: 22 mg/dL — ABNORMAL HIGH (ref 6–20)
CO2: 26 mmol/L (ref 22–32)
Calcium: 9.1 mg/dL (ref 8.9–10.3)
Chloride: 107 mmol/L (ref 98–111)
Creatinine, Ser: 0.83 mg/dL (ref 0.44–1.00)
GFR, Estimated: >60 mL/min (ref >60)
Glucose, Bld: 86 mg/dL (ref 70–99)
Potassium: 3.8 mmol/L (ref 3.5–5.1)
Sodium: 138 mmol/L (ref 135–145)

## 2022-04-16 NOTE — ED Triage Notes (Signed)
Pt arrives with c/o headache that has been ongoing for the last 3 days. Pt endorses nausea. Pt denies blurry vision.

## 2022-04-16 NOTE — ED Notes (Signed)
No answer when called several times from lobby 

## 2022-08-03 ENCOUNTER — Ambulatory Visit: Payer: Medicaid Other

## 2022-08-03 ENCOUNTER — Encounter: Payer: Self-pay | Admitting: Family Medicine

## 2022-08-03 ENCOUNTER — Ambulatory Visit: Payer: Medicaid Other | Admitting: Family Medicine

## 2022-08-03 DIAGNOSIS — Z113 Encounter for screening for infections with a predominantly sexual mode of transmission: Secondary | ICD-10-CM | POA: Diagnosis not present

## 2022-08-03 LAB — WET PREP FOR TRICH, YEAST, CLUE
Trichomonas Exam: NEGATIVE
Yeast Exam: NEGATIVE

## 2022-08-03 LAB — HM HIV SCREENING LAB: HM HIV Screening: NEGATIVE

## 2022-08-03 NOTE — Progress Notes (Signed)
[facility] Department  STI clinic/screening visit [place] [id number] [phone]  Subjective:  Jill Rivers is a 31 y.o. female being seen today for an STI screening visit. The patient reports they do have symptoms.  Patient reports that they do not desire a pregnancy in the next year.   They reported they are not interested in discussing contraception today.    No LMP recorded. (Menstrual status: IUD).   Patient has the following medical conditions:   Patient Active Problem List   Diagnosis Date Noted   Morbid obesity ([place]) 187 lbs 08/15/2020   Delirium, drug-induced 12/18/2016   Cannabis abuse 12/18/2016   Cancer [facility])     Chief Complaint  Patient presents with   SEXUALLY TRANSMITTED DISEASE    Screening exam. Pain in uterine area-questionable related to IUD    HPI  Patient reports to clinic with c/o abdominal cramping. She states that she had a previous IUD that was removed and "had lots of clots on it". She currently has an IUD in place, and wonders if this is the same problem. States her cramping has been going on x 1 month. It was worse during her menses. Some increase in bleeding, but has no visualized clots. Endorses using tylenol with cramping with no relief.  Does the patient using douching products? No  Last HIV test per patient/review of record was  Lab Results  Component Value Date   HMHIVSCREEN Negative - Validated 12/22/2021   No results found for: "HIV" Patient reports last pap was unknown, patient states they are going to contact their PCP and set this up.    Screening for MPX risk: Does the patient have an unexplained rash? No Is the patient MSM? No Does the patient endorse multiple sex partners or anonymous sex partners? No Did the patient have close or sexual contact with a person diagnosed with MPX? No Has the patient traveled outside the [place] where MPX is endemic? No Is there a high clinical suspicion for  MPX-- evidenced by one of the following No  -Unlikely to be chickenpox  -Lymphadenopathy  -Rash that present in same phase of evolution on any given body part See flowsheet for further details and programmatic requirements.   Immunization history:  Immunization History  Administered Date(s) Administered   Hepatitis B 08[birth year]04[birth year]92, 01/29/1992, 06/03/1992   Hpv-Unspecified 12/13/2008   MMR 09/09/2010   PFIZER(Purple Top)SARS-COV-2 Vaccination 04/08/2020, 04/29/2020   Tdap 08/05/2015     The following portions of the patient's history were reviewed and updated as appropriate: allergies, current medications, past medical history, past social history, past surgical history and problem list.  Objective:  There were no vitals filed for this visit.  Physical Exam Vitals and nursing note reviewed.  Constitutional:      Appearance: Normal appearance.  HENT:     Head: Normocephalic and atraumatic.     Mouth/Throat:     Mouth: Mucous membranes are moist.     Pharynx: Oropharynx is clear. No oropharyngeal exudate or posterior oropharyngeal erythema.  Pulmonary:     Effort: Pulmonary effort is normal.  Abdominal:     General: Abdomen is flat.     Palpations: There is no mass.     Tenderness: There is no abdominal tenderness. There is no rebound.  Genitourinary:    General: Normal vulva.     Exam position: Lithotomy position.     Pubic Area: No rash or pubic lice.      Labia:  Right: No rash or lesion.        Left: No rash or lesion.      Vagina: Normal. No vaginal discharge, erythema, bleeding or lesions.     Cervix: No cervical motion tenderness, discharge, friability, lesion or erythema.     Uterus: Normal.      Adnexa: Right adnexa normal and left adnexa normal.     Rectum: Normal.     Comments: pH = 4 Lymphadenopathy:     Head:     Right side of head: No preauricular or posterior auricular adenopathy.     Left side of head: No preauricular or posterior auricular  adenopathy.     Cervical: No cervical adenopathy.     Upper Body:     Right upper body: No supraclavicular, axillary or epitrochlear adenopathy.     Left upper body: No supraclavicular, axillary or epitrochlear adenopathy.     Lower Body: No right inguinal adenopathy. No left inguinal adenopathy.  Skin:    General: Skin is warm and dry.     Findings: No rash.  Neurological:     Mental Status: She is alert and oriented to person, place, and time.      Assessment and Plan:  Jill Rivers is a 31 y.o. female presenting to the [facility] Department for STI screening  1. Screening for venereal disease Consult with Benjamine Mola, CNM regarding patients cramping. Advised to try ibuprofen. Patient states she hasn't tried that before but is agreeable to this.  Reviewed that other causes of cramping could be fibroids- patient will come back if the ibuprofen doesn't work.   Patient accepted all screenings including  vaginal CT/GC and bloodwork for HIV/RPR and wet prep Patient meets criteria for HepB screening? No. Ordered? No - declined Patient meets criteria for HepC screening? No. Ordered? No - declined  Treat wet prep per standing order Discussed time line for State Lab results and that patient will be called with positive results and encouraged patient to call if she had not heard in 2 weeks.  Counseled to return or seek care for continued or worsening symptoms Recommended condom use with all sex  Patient is currently using *Mirena to prevent pregnancy.   - HIV [place] [facility] - Syphilis Serology, [place] [facility] [place] [place]   Return if symptoms worsen or fail to improve.  Total time spent 20 minutes.  Sharlet Salina, [place]

## 2023-10-15 ENCOUNTER — Ambulatory Visit: Payer: Medicaid Other

## 2023-10-19 ENCOUNTER — Encounter: Payer: Self-pay | Admitting: Nurse Practitioner

## 2023-10-19 ENCOUNTER — Ambulatory Visit

## 2023-10-19 DIAGNOSIS — Z113 Encounter for screening for infections with a predominantly sexual mode of transmission: Secondary | ICD-10-CM

## 2023-10-19 LAB — HM HIV SCREENING LAB: HM HIV Screening: NEGATIVE

## 2023-10-19 LAB — WET PREP FOR TRICH, YEAST, CLUE
Trichomonas Exam: NEGATIVE
Yeast Exam: NEGATIVE

## 2023-10-19 NOTE — Progress Notes (Signed)
 [facility] Department STI clinic [address] [place] [address] Main phone: [phone]  STI screening visit  Subjective:  Jill Rivers is a 33 y.o. female being seen today for an STI screening visit. The patient reports they do have symptoms.  Patient reports that they do not desire a pregnancy in the next year.   They reported they are not interested in discussing contraception today.    Patient has the following medical conditions:  Patient Active Problem List   Diagnosis Date Noted   Morbid obesity (HCC) 187 lbs 08/15/2020   Delirium, drug-induced 12/18/2016   Cannabis abuse 12/18/2016   Cancer [facility])     Chief Complaint  Patient presents with   SEXUALLY TRANSMITTED DISEASE    Vaginal irritation   Patient is a pleasant 33 y.o. female who presents to the office today requesting symptomatic STI testing. She endorses increased vaginal discharge that is white, thin, with no odor present since she began taking oral probiotics.  Patient indicates 1 female partner in the last 2 months. She reports practicing vaginal sex and uses condoms sometimes. Patient indicates a history of chlamydia in 2020. Patient reports last sex was 2 weeks ago without a condom. She indicates Mirena IUD as contraception method.  Patient indicates not having menstrual periods while having IUD in place.    Does the patient using douching products? No  Last HIV test per patient/review of record was  Lab Results  Component Value Date   HMHIVSCREEN Negative - Validated 08/03/2022   No results found for: "HIV"   Last HEPC test per patient/review of record was No results found for: "HMHEPCSCREEN" No components found for: "HEPC"   Last HEPB test per patient/review of record was No components found for: "HMHEPBSCREEN"   Patient reports last pap was: No results found for: "DIAGPAP", "HPVHIGH", "ADEQPAP" No results found for: "SPECADGYN" No Cervical Cancer Screening  results to display.  Screening for MPX risk: Does the patient have an unexplained rash? No Is the patient MSM? No Does the patient endorse multiple sex partners or anonymous sex partners? No Did the patient have close or sexual contact with a person diagnosed with MPX? No Has the patient traveled outside the [place] where MPX is endemic? No Is there a high clinical suspicion for MPX-- evidenced by one of the following No  -Unlikely to be chickenpox  -Lymphadenopathy  -Rash that present in same phase of evolution on any given body part See flowsheet for further details and programmatic requirements.   Immunization history:  Immunization History  Administered Date(s) Administered   Hepatitis B [birth year] 1991, 01/29/1992, 06/03/1992   Hpv-Unspecified 12/13/2008   MMR 09/09/2010   PFIZER(Purple Top)SARS-COV-2 Vaccination 04/08/2020, 04/29/2020   Tdap 08/05/2015     The following portions of the patient's history were reviewed and updated as appropriate: allergies, current medications, past medical history, past social history, past surgical history and problem list.  Objective:  There were no vitals filed for this visit.  Physical Exam Nursing note reviewed. Chaperone present: Chaperone declined.  Constitutional:      Appearance: Normal appearance.  HENT:     Head: Normocephalic.     Salivary Glands: Right salivary gland is not diffusely enlarged or tender. Left salivary gland is not diffusely enlarged or tender.     Mouth/Throat:     Lips: Pink. No lesions.     Mouth: Mucous membranes are moist.     Tongue: No lesions. Tongue does not deviate from  midline.     Pharynx: Oropharynx is clear. Uvula midline.     Tonsils: No tonsillar exudate.  Eyes:     General:        Right eye: No discharge.        Left eye: No discharge.  Pulmonary:     Effort: Pulmonary effort is normal.  Genitourinary:    General: Normal vulva.     Exam position: Lithotomy position.     Pubic Area: No rash or  pubic lice.      Tanner stage (genital): 5.     Labia:        Right: No rash, tenderness, lesion or injury.        Left: No rash, tenderness, lesion or injury.      Vagina: Normal. No vaginal discharge, erythema, tenderness, bleeding or lesions.     Cervix: Discharge present. No cervical motion tenderness, friability, lesion, erythema, cervical bleeding or eversion.     Uterus: Normal.      Adnexa: Right adnexa normal and left adnexa normal.     Comments: pH<4.5. Minimal amount of malodorous, non-adherent, thin and frothy, white vaginal discharge present within vaginal canal.  Lymphadenopathy:     Head:     Right side of head: No submental, submandibular, tonsillar, preauricular or posterior auricular adenopathy.     Left side of head: No submental, submandibular, tonsillar, preauricular or posterior auricular adenopathy.     Cervical: No cervical adenopathy.     Right cervical: No superficial or posterior cervical adenopathy.    Left cervical: No superficial or posterior cervical adenopathy.     Upper Body:     Right upper body: No supraclavicular or axillary adenopathy.     Left upper body: No supraclavicular or axillary adenopathy.     Lower Body: No right inguinal adenopathy. No left inguinal adenopathy.  Skin:    General: Skin is warm and dry.     Findings: No rash.     Comments: Skin tone appropriate for ethnicity.   Neurological:     Mental Status: She is alert and oriented to person, place, and time.  Psychiatric:        Attention and Perception: Attention and perception normal.        Mood and Affect: Mood and affect normal.        Speech: Speech normal.        Behavior: Behavior normal. Behavior is cooperative.        Thought Content: Thought content normal.     Assessment and Plan:  Jill Rivers is a 33 y.o. female presenting to the [facility] Department for STI screening  1. Screening for venereal disease (Primary) Patient Wet Prep positive for  amine today. Amsel criteria not met as no clue cells present on Wet Prep and vaginal discharge pH was <4.5 (WNL).  Discussed results with patient. Provided handout in native language that gives guidance on ways to prevent BV including cotton underwear, sleeping without underwear, wearing loose fitting clothing, using a hair dryer on low, cool setting after showering to dry instead of towel drying, using a mild soap unscented soap, no baths and showers only, no douching, and stopping smoking. Also encouraged boric acid vaginal suppositories 600mg  to be used intravaginally at night (not orally ingested) and oral probiotics containing L rhamnosus GR and Lactobacillus reuteri RC-14. Advised patient to notify this office if symptoms worsen or fail to improve.   - Chlamydia/Gonorrhea [place] [facility] - HIV [place]  STATE LAB - Syphilis Serology, [place] [facility] - WET PREP FOR TRICH, YEAST, CLUE  Patient accepted screenings including vaginal CT/GC and bloodwork for HIV/RPR, and wet prep. Patient meets criteria for HepB screening? No. Ordered? not applicable Patient meets criteria for HepC screening? No. Ordered? not applicable  Treat wet prep per standing order Discussed time line for State Lab results and that patient will be called with positive results and encouraged patient to call if she had not heard in 2 weeks.  Counseled to return or seek care for continued or worsening symptoms Recommended repeat testing in 3 months with positive results. Recommended condom use with all sex for STI prevention.   Patient is currently using *Mirena to prevent pregnancy.    Return if symptoms worsen or fail to improve.  No future appointments.  Total time with patient 30 minutes.   Edmonia James, NP

## 2023-10-19 NOTE — Progress Notes (Signed)
 Pt is here for STD visit.  Wet prep results reviewed with pt, no treatment required per standing order.  Condoms declined.  Gaspar Garbe, RN

## 2023-10-21 ENCOUNTER — Ambulatory Visit: Payer: Medicaid Other

## 2024-08-08 ENCOUNTER — Ambulatory Visit: Payer: Self-pay

## 2024-08-08 DIAGNOSIS — Z113 Encounter for screening for infections with a predominantly sexual mode of transmission: Secondary | ICD-10-CM

## 2024-08-08 LAB — WET PREP FOR TRICH, YEAST, CLUE
Clue Cell Exam: NEGATIVE
Trichomonas Exam: NEGATIVE
Yeast Exam: NEGATIVE

## 2024-08-08 LAB — PREGNANCY, URINE: Preg Test, Ur: NEGATIVE

## 2024-08-08 NOTE — Progress Notes (Signed)
 " [facility] Department STI clinic [address], [address] [place] [address] Main phone: [phone]  STI screening visit  Subjective:  Jill Rivers is a 33 y.o. female being seen today for an STI screening visit. The patient reports they do not have symptoms.    Patient has the following medical conditions:  Patient Active Problem List   Diagnosis Date Noted   Morbid obesity (HCC) 187 lbs 08/15/2020   Delirium, drug-induced 12/18/2016   Cannabis abuse 12/18/2016   Cancer [facility])    Chief Complaint  Patient presents with   SEXUALLY TRANSMITTED DISEASE   HPI Patient reports desire for STI testing today. Had Mirena IUD removed in October. Had normal period in November and then in December only had 2 days of spotting. Desires a pregnancy. Is taking a prenatal vitamin.  Reproductive considerations Patient reports they are not pregnant . They desire a pregnancy in the next year. Patient is currently using no method - no contraceptive precautions to prevent pregnancy. They reported they are not interested in discussing contraception today.    Patient's last menstrual period was 08/04/2024 (approximate).  Does the patient using douching products? No  Patient's routine cervical screening is overdue. She does not want today.  See flowsheet for further details and programmatic requirements Hyperlink available at the top of the signed note in blue.  Flow sheet content below:  Pregnancy Intention Screening Does the patient want to become pregnant in the next year?: Yes Does the patient's partner want to become pregnant in the next year?: N/A Would the patient like to discuss contraceptive options today?: No Reason For STD Screen STD Screening: Is asymptomatic Have you ever had an STD?: Yes History of Antibiotic use in the past 2 weeks?: No STD Symptoms Denies all: Yes Risk Factors for Hep B Household, sexual, or needle sharing contact of a person  infected with Hep B: No Sexual contact with a person who uses drugs not as prescribed?: No Currently or Ever used drugs not as prescribed: No HIV Positive: No PRep Patient: No Men who have sex with men: No Have Hepatitis C: No History of Incarceration: No History of Homeslessness?: No Anal sex following anal drug use?: No Risk Factors for Hep C Currently using drugs not as prescribed: No Sexual partner(s) currently using drugs as not prescribed: No History of drug use: No HIV Positive: No People with a history of incarceration: No People born between the years of 30 and 75: No Counseling Patient counseled to use condoms with all sex: Condoms declined RTC in 2-3 weeks for test results: Yes Clinic will call if test results abnormal before test result appt.: Yes Test results given to patient Patient counseled to use condoms with all sex: Condoms declined   Screening for MPX risk:  Unexplained rash?  No   MSM?  No   Multiple or anonymous sex partners?  No   Any close or sexual contact with a person  diagnosed with MPX?  No   Any outside the [place]  where MPX is endemic?  No   High clinical suspicion for MPX?    -Unlikely to be chickenpox    -Lymphadenopathy    -Rash that presents in same phase of       evolution on any given body part  No   Does this patient meet CDC recommendations for vaccination against MPOX? No  You already have or anticipate having the following risks:  Your sex partner has the following risks:  You're traveling to a county with a clade I MPOX outbreak and anticipate these risks: Occupational exposure  You had known or suspected exposure to someone with monkeypox You had a sex partner in the past 2 weeks who was diagnosed with monkeypox You are a gay, bisexual, or other man who has sex with men, or are transgender or nonbinary and in the past 6 months have had any of the following: - A new diagnosis of one or more sexually transmitted diseases (e.g.,  chlamydia, gonorrhea, or syphilis) - More than one sex partner You have had any of the following in the past 6 months: - Sex at a commercial sex venue (like a sex club or bathhouse) - Sex related to a large commercial event   or in a geographic area (city or county for example) where mpox virus transmission is occurring Sex with a new partner Sex at a commercial sex venue (e.g., a sex club or bathhouse) Sex in it consultant for money, goods, drugs, or other trade Sex in association with a large public event (e.g., a rave, party, or festival) i.e. certain people who work in a laboratory or healthcare facility   Infectious disease screenings: Vaccinated against HPV? Yes  HIV Ever had a positive? No Last test: 2025 Results in chart:  Lab Results  Component Value Date   HMHIVSCREEN Negative - Validated 10/19/2023   No results found for: HIV   Hep B Hep B status: unknown or no prior testing Received HBV vaccination? No Received HBV testing for immunity? Unknown Results in chart:  No components found for: HMHEPBSCREEN  Do they qualify for HBV screening today? No Criteria:  -Household, sexual or needle sharing contact with HBV -History of drug use or homelessness -HIV positive -Those with known Hep C  Hep C Hep C status: unknown or no prior testing Results in chart:  No results found for: HMHEPCSCREEN No components found for: HEPC  Do they qualify for HCV screening today? No Criteria - since the last HCV result, does the patient have any of the following? - Current drug use - Have a partner with drug use - Has been incarcerated  Immunization history:  Immunization History  Administered Date(s) Administered   Hepatitis B 04-27-1992, 01/29/1992, 06/03/1992   Hpv-Unspecified 12/13/2008   MMR 09/09/2010   PFIZER(Purple Top)SARS-COV-2 Vaccination 04/08/2020, 04/29/2020   Tdap 08/05/2015    The following portions of the patient's history were reviewed and updated as  appropriate: allergies, current medications, past medical history, past social history, past surgical history and problem list.  Substance use screenings:  Uses tobacco products? Yes Uses vapes? No Uses alcohol? Yes, occasional  Uses non-injectable substances that alter your mental status? No Uses non-prescribed injectable substances? No  Objective:  There were no vitals filed for this visit.  Physical Exam Vitals and nursing note reviewed.  Constitutional:      Appearance: Normal appearance.  HENT:     Head: Normocephalic and atraumatic.     Comments: No nits or hair loss on scalp, brows, and lashes    Mouth/Throat:     Mouth: Mucous membranes are moist.     Pharynx: Oropharynx is clear. No oropharyngeal exudate or posterior oropharyngeal erythema.  Eyes:     General:        Right eye: No discharge.        Left eye: No discharge.     Conjunctiva/sclera: Conjunctivae normal.     Right eye: Right conjunctiva is not injected.  Left eye: Left conjunctiva is not injected.  Pulmonary:     Effort: Pulmonary effort is normal.  Abdominal:     Tenderness: There is no abdominal tenderness. There is no rebound.  Genitourinary:    Comments: Politely declined genital exam. Lymphadenopathy:     Cervical: No cervical adenopathy.     Upper Body:     Right upper body: No supraclavicular adenopathy.     Left upper body: No supraclavicular adenopathy.     Comments: Patient declines pelvic exam, inguinal lymph nodes not evaluated.  Skin:    General: Skin is warm and dry.     Findings: No lesion or rash.  Neurological:     Mental Status: She is alert and oriented to person, place, and time.  Psychiatric:        Mood and Affect: Mood normal.    Assessment and Plan:  ANAIZA BEHRENS is a 33 y.o. female presenting to the [facility] Department for STI screening.  Patient accepted the following screenings: vaginal CT/GC swab, vaginal wet prep, HIV, and RPR  1. Screening  for venereal disease (Primary)  - Pregnancy, urine: only had 2 days of spotting in December, trying to conceive  - WET PREP FOR TRICH, YEAST, CLUE - Chlamydia/GC NAA, Confirmation - HIV Antibody (routine testing w rflx)   Counseling: Discussed time line for results and that patient will be called with positive results and encouraged patient to call if they had not heard in 2 weeks.  Counseled to return or seek care for continued or worsening symptoms Recommended repeat testing in 3 months with positive results. Recommended condom use with all sex for STI prevention.   Return for STI testing as needed.  No future appointments.  Damien FORBES Satchel, NP "

## 2024-08-09 LAB — SYPHILIS: RPR WITH REFLEX TO RPR TITER: RPR Ser Ql: NONREACTIVE

## 2024-08-09 LAB — HIV ANTIBODY (ROUTINE TESTING W REFLEX): HIV Screen 4th Generation wRfx: NONREACTIVE

## 2024-08-11 LAB — CHLAMYDIA/GC NAA, CONFIRMATION
Chlamydia trachomatis, NAA: NEGATIVE
Neisseria gonorrhoeae, NAA: NEGATIVE

## 2024-08-28 NOTE — Addendum Note (Signed)
 Addended by: Bayne Fosnaugh on: 08/28/2024 02:30 PM   Modules accepted: Orders
